# Patient Record
Sex: Female | Born: 1950 | Race: White | Hispanic: No | Marital: Married | Smoking: Former smoker
Health system: Southern US, Community
[De-identification: ages and names within clinical notes are randomized; demographics above are authoritative.]

## PROBLEM LIST (undated history)

## (undated) DIAGNOSIS — C801 Malignant (primary) neoplasm, unspecified: Secondary | ICD-10-CM

## (undated) DIAGNOSIS — K5900 Constipation, unspecified: Secondary | ICD-10-CM

## (undated) DIAGNOSIS — F32A Depression, unspecified: Secondary | ICD-10-CM

## (undated) DIAGNOSIS — F419 Anxiety disorder, unspecified: Secondary | ICD-10-CM

## (undated) DIAGNOSIS — I10 Essential (primary) hypertension: Secondary | ICD-10-CM

## (undated) DIAGNOSIS — H269 Unspecified cataract: Secondary | ICD-10-CM

## (undated) DIAGNOSIS — L409 Psoriasis, unspecified: Secondary | ICD-10-CM

## (undated) DIAGNOSIS — K219 Gastro-esophageal reflux disease without esophagitis: Secondary | ICD-10-CM

## (undated) DIAGNOSIS — M1990 Unspecified osteoarthritis, unspecified site: Secondary | ICD-10-CM

## (undated) HISTORY — DX: Gastro-esophageal reflux disease without esophagitis: K21.9

## (undated) HISTORY — DX: Unspecified osteoarthritis, unspecified site: M19.90

## (undated) HISTORY — PX: BELT ABDOMINOPLASTY: SHX1215

## (undated) HISTORY — PX: EYE SURGERY: SHX253

## (undated) HISTORY — DX: Unspecified cataract: H26.9

## (undated) HISTORY — DX: Depression, unspecified: F32.A

## (undated) HISTORY — DX: Essential (primary) hypertension: I10

## (undated) HISTORY — PX: JOINT REPLACEMENT: SHX530

## (undated) HISTORY — PX: CATARACT EXTRACTION: SUR2

## (undated) HISTORY — PX: MELANOMA EXCISION: SHX5266

---

## 2013-09-06 ENCOUNTER — Encounter (HOSPITAL_BASED_OUTPATIENT_CLINIC_OR_DEPARTMENT_OTHER): Payer: Self-pay | Admitting: Emergency Medicine

## 2013-09-06 ENCOUNTER — Emergency Department (HOSPITAL_BASED_OUTPATIENT_CLINIC_OR_DEPARTMENT_OTHER): Payer: Medicare Other

## 2013-09-06 ENCOUNTER — Emergency Department (HOSPITAL_BASED_OUTPATIENT_CLINIC_OR_DEPARTMENT_OTHER)
Admission: EM | Admit: 2013-09-06 | Discharge: 2013-09-06 | Disposition: A | Discharge status: Home / Self Care | Payer: Medicare Other | Attending: Emergency Medicine | Admitting: Emergency Medicine

## 2013-09-06 DIAGNOSIS — F411 Generalized anxiety disorder: Secondary | ICD-10-CM | POA: Insufficient documentation

## 2013-09-06 DIAGNOSIS — F419 Anxiety disorder, unspecified: Secondary | ICD-10-CM

## 2013-09-06 DIAGNOSIS — R202 Paresthesia of skin: Secondary | ICD-10-CM

## 2013-09-06 DIAGNOSIS — Z7982 Long term (current) use of aspirin: Secondary | ICD-10-CM | POA: Insufficient documentation

## 2013-09-06 DIAGNOSIS — R209 Unspecified disturbances of skin sensation: Secondary | ICD-10-CM | POA: Insufficient documentation

## 2013-09-06 DIAGNOSIS — Z87891 Personal history of nicotine dependence: Secondary | ICD-10-CM | POA: Insufficient documentation

## 2013-09-06 LAB — CBC WITH DIFFERENTIAL/PLATELET
Basophils Absolute: 0 10*3/uL (ref 0.0–0.1)
Basophils Relative: 1 % (ref 0–1)
EOS PCT: 4 % (ref 0–5)
Eosinophils Absolute: 0.2 10*3/uL (ref 0.0–0.7)
HCT: 38.9 % (ref 36.0–46.0)
Hemoglobin: 13.1 g/dL (ref 12.0–15.0)
LYMPHS PCT: 23 % (ref 12–46)
Lymphs Abs: 1 10*3/uL (ref 0.7–4.0)
MCH: 32 pg (ref 26.0–34.0)
MCHC: 33.7 g/dL (ref 30.0–36.0)
MCV: 95.1 fL (ref 78.0–100.0)
Monocytes Absolute: 0.3 10*3/uL (ref 0.1–1.0)
Monocytes Relative: 7 % (ref 3–12)
NEUTROS ABS: 2.7 10*3/uL (ref 1.7–7.7)
Neutrophils Relative %: 65 % (ref 43–77)
PLATELETS: 216 10*3/uL (ref 150–400)
RBC: 4.09 MIL/uL (ref 3.87–5.11)
RDW: 12.1 % (ref 11.5–15.5)
WBC: 4.1 10*3/uL (ref 4.0–10.5)

## 2013-09-06 LAB — BASIC METABOLIC PANEL
ANION GAP: 15 (ref 5–15)
BUN: 10 mg/dL (ref 6–23)
CALCIUM: 9.5 mg/dL (ref 8.4–10.5)
CHLORIDE: 100 meq/L (ref 96–112)
CO2: 24 meq/L (ref 19–32)
CREATININE: 0.7 mg/dL (ref 0.50–1.10)
GFR calc Af Amer: >90 mL/min (ref >90)
GFR calc non Af Amer: 90 mL/min — ABNORMAL LOW (ref >90)
Glucose, Bld: 120 mg/dL — ABNORMAL HIGH (ref 70–99)
Potassium: 4.1 mEq/L (ref 3.7–5.3)
SODIUM: 139 meq/L (ref 137–147)

## 2013-09-06 LAB — TROPONIN I: Troponin I: <0.3 ng/mL (ref <0.30)

## 2013-09-06 MED ORDER — ASPIRIN 81 MG PO CHEW: 81.0000 mg | CHEWABLE_TABLET | Freq: Every day | ORAL | Status: DC

## 2013-09-06 MED ORDER — LORAZEPAM 2 MG/ML IJ SOLN
0.5000 mg | Freq: Once | INTRAMUSCULAR | Status: AC
  Administered 2013-09-06: 0.5 mg via INTRAVENOUS
  Filled 2013-09-06: qty 1

## 2013-09-06 NOTE — ED Notes (Signed)
Reports SHOB. Left arm tingling. Stroke screen negative. Feels like a lump in her throat.

## 2013-09-06 NOTE — ED Provider Notes (Signed)
CSN: [id number]     Arrival date & time 09/06/13  1536 History   First MD Initiated Contact with Patient 09/06/13 1543     Chief Complaint  Patient presents with  . Shortness of Breath     (Consider location/radiation/quality/duration/timing/severity/associated sxs/prior Treatment) HPI  This is a 1 are old female with no significant past medical history who presents with left arm tingling and shortness of breath. Patient reports onset of symptoms at 2 PM while she was at work. She works at [employer] and sits at a computer most the day. She noted left arm tingling "like my arm was going to sleep."  She denies any weakness of the arm or hand. She reports increasing anxiety symptoms and subsequently developed shortness of breath and "a lump in my throat. Symptoms have currently resolved. She also reports dry mouth. She denies any chest pain, recent fevers, recent cough. Patient denies any weakness, numbness, speech difficulty or any other focal deficits. Patient is a former smoker but otherwise denies history of heart pressure or high cholesterol.  History reviewed. No pertinent past medical history. History reviewed. No pertinent past surgical history. No family history on file. History  Substance Use Topics  . Smoking status: Former Research scientist (life sciences)  . Smokeless tobacco: Not on file  . Alcohol Use: No   OB History   Grav Para Term Preterm Abortions TAB SAB Ect Mult Living                 Review of Systems  Constitutional: Negative for fever.  HENT:       Dry mouth  Eyes: Negative for visual disturbance.  Respiratory: Positive for shortness of breath. Negative for cough and chest tightness.   Cardiovascular: Negative for chest pain.  Gastrointestinal: Negative for nausea, vomiting, abdominal pain and diarrhea.  Genitourinary: Negative for dysuria.  Musculoskeletal: Negative for back pain.  Skin: Negative for wound.  Neurological: Positive for numbness. Negative for dizziness, speech  difficulty, weakness and headaches.  Psychiatric/Behavioral: Negative for confusion. The patient is nervous/anxious.   All other systems reviewed and are negative.     Allergies  Review of patient's allergies indicates no known allergies.  Home Medications   Prior to Admission medications   Medication Sig Start Date End Date Taking? Authorizing Provider  aspirin 81 MG chewable tablet Chew 1 tablet (81 mg total) by mouth daily. 09/06/13   Merryl Hacker, MD   BP 152/75  Pulse 57  Temp(Src) 98.1 F (36.7 C) (Oral)  Resp 20  Ht 5\' 3"  (1.6 m)  Wt 110 lb (49.896 kg)  BMI 19.49 kg/m2  SpO2 100% Physical Exam  Nursing note and vitals reviewed. Constitutional: She is oriented to person, place, and time. She appears well-developed and well-nourished.  Anxious and tearful  HENT:  Head: Normocephalic and atraumatic.  Mouth/Throat: Oropharynx is clear and moist.  Eyes: EOM are normal. Pupils are equal, round, and reactive to light.  Neck: Neck supple.  Cardiovascular: Normal rate, regular rhythm and normal heart sounds.   No murmur heard. Pulmonary/Chest: Effort normal and breath sounds normal. No respiratory distress. She has no wheezes.  Abdominal: Soft. Bowel sounds are normal. There is no tenderness. There is no rebound and no guarding.  Musculoskeletal: She exhibits no edema.  Neurological: She is alert and oriented to person, place, and time.  Cranial nerves II through XII intact, no dysmetria to finger-nose-finger, no drift noted, 5 out of 5 strength in all 4 extremities, no clonus noted  Skin: Skin is warm and dry.  Psychiatric: She has a normal mood and affect.    ED Course  Procedures (including critical care time) Labs Review Labs Reviewed  BASIC METABOLIC PANEL - Abnormal; Notable for the following:    Glucose, Bld 120 (*)    GFR calc non Af Amer 90 (*)    All other components within normal limits  CBC WITH DIFFERENTIAL  TROPONIN I    Imaging Review Dg Chest  2 View  09/06/2013   CLINICAL DATA:  Shortness of breath.  Anxiety.  EXAM: CHEST  2 VIEW  COMPARISON:  None.  FINDINGS: Cardiomediastinal silhouette unremarkable. Lungs clear. Bronchovascular markings normal. Pulmonary vascularity normal. No visible pleural effusions. No pneumothorax. Thoracic scoliosis convex right with compensatory thoracolumbar scoliosis convex left. Mild degenerative changes involving the thoracic spine.  IMPRESSION: No acute cardiopulmonary disease.   Electronically Signed   By: Evangeline Dakin M.D.   On: 09/06/2013 16:42   Ct Head Wo Contrast  09/06/2013   CLINICAL DATA:  Anxiety.  EXAM: CT HEAD WITHOUT CONTRAST  TECHNIQUE: Contiguous axial images were obtained from the base of the skull through the vertex without intravenous contrast.  COMPARISON:  No prior.  FINDINGS: No mass. No hydrocephalus. No hemorrhage. No acute bony abnormality. There has paranasal sinuses and mastoids are clear no acute bony abnormality.  IMPRESSION: No acute abnormality.   Electronically Signed   By: Marcello Moores  Register   On: 09/06/2013 16:44     EKG Interpretation   Date/Time:  Tuesday September 06 2013 15:51:27 EDT Ventricular Rate:  71 PR Interval:  144 QRS Duration: 90 QT Interval:  406 QTC Calculation: 441 R Axis:   70 Text Interpretation:  Normal sinus rhythm Normal ECG NO prior for  comparison Confirmed by HORTON  MD, COURTNEY ([id number]) on 09/06/2013 3:54:07  PM      MDM   Final diagnoses:  Arm paresthesia, right  Anxiety    Patient presents with left arm tingling and shortness of breath. She is nontoxic but very anxious appearing on exam. She is nonfocal. Considerations include nerve impingement, paresthesia, TIA. Suspect subsequent symptoms likely related to anxiety. Vital signs are reassuring. EKG and troponin negative. Chest x-ray reassuring.  Basic labwork also reassuring. Patient reports complete resolution of symptoms following IV Ativan. She continues to be nonfocal. Head CT is  negative. Patient cannot get an MRI because she currently has metal braces. At this time have low suspicion for TIA and feel patient symptoms may be related to nerve impingement and subsequent anxiety. Patient's ABCD2 score is 2, 1 for age and 1 for hypertension at evaluation.  Discussed with patient all possibilities. Have offered the patient admission for formal TIA rule out versus close PCP followup and outpatient workup. Patient has elected outpatient workup. Will place patient on a baby aspirin daily. Patient was given strict return precautions.  After history, exam, and medical workup I feel the patient has been appropriately medically screened and is safe for discharge home. Pertinent diagnoses were discussed with the patient. Patient was given return precautions.     Merryl Hacker, MD 09/06/13 [phone]

## 2013-09-06 NOTE — Discharge Instructions (Signed)

## 2013-09-16 DIAGNOSIS — F419 Anxiety disorder, unspecified: Secondary | ICD-10-CM | POA: Insufficient documentation

## 2013-09-16 DIAGNOSIS — F410 Panic disorder [episodic paroxysmal anxiety] without agoraphobia: Secondary | ICD-10-CM | POA: Insufficient documentation

## 2013-10-21 DIAGNOSIS — C4371 Malignant melanoma of right lower limb, including hip: Secondary | ICD-10-CM | POA: Insufficient documentation

## 2013-10-21 DIAGNOSIS — Z0000 Encounter for general adult medical examination without abnormal findings: Secondary | ICD-10-CM | POA: Insufficient documentation

## 2013-10-21 DIAGNOSIS — L409 Psoriasis, unspecified: Secondary | ICD-10-CM | POA: Insufficient documentation

## 2015-01-23 DIAGNOSIS — M25511 Pain in right shoulder: Secondary | ICD-10-CM | POA: Insufficient documentation

## 2015-02-04 DIAGNOSIS — M19011 Primary osteoarthritis, right shoulder: Secondary | ICD-10-CM | POA: Insufficient documentation

## 2015-02-04 DIAGNOSIS — M25819 Other specified joint disorders, unspecified shoulder: Secondary | ICD-10-CM | POA: Insufficient documentation

## 2016-01-15 DIAGNOSIS — R0781 Pleurodynia: Secondary | ICD-10-CM | POA: Insufficient documentation

## 2016-01-15 DIAGNOSIS — G4700 Insomnia, unspecified: Secondary | ICD-10-CM | POA: Insufficient documentation

## 2016-12-12 DIAGNOSIS — G2581 Restless legs syndrome: Secondary | ICD-10-CM | POA: Insufficient documentation

## 2017-07-14 DIAGNOSIS — K582 Mixed irritable bowel syndrome: Secondary | ICD-10-CM | POA: Insufficient documentation

## 2017-07-20 ENCOUNTER — Other Ambulatory Visit: Payer: Self-pay

## 2017-07-20 ENCOUNTER — Emergency Department (HOSPITAL_BASED_OUTPATIENT_CLINIC_OR_DEPARTMENT_OTHER)
Admission: EM | Admit: 2017-07-20 | Discharge: 2017-07-20 | Disposition: A | Discharge status: Home / Self Care | Payer: Medicare HMO | Attending: Physician Assistant | Admitting: Physician Assistant

## 2017-07-20 ENCOUNTER — Encounter (HOSPITAL_BASED_OUTPATIENT_CLINIC_OR_DEPARTMENT_OTHER): Payer: Self-pay | Admitting: Emergency Medicine

## 2017-07-20 ENCOUNTER — Emergency Department (HOSPITAL_BASED_OUTPATIENT_CLINIC_OR_DEPARTMENT_OTHER): Payer: Medicare HMO

## 2017-07-20 DIAGNOSIS — Z7982 Long term (current) use of aspirin: Secondary | ICD-10-CM | POA: Insufficient documentation

## 2017-07-20 DIAGNOSIS — R109 Unspecified abdominal pain: Secondary | ICD-10-CM | POA: Diagnosis not present

## 2017-07-20 DIAGNOSIS — Z79899 Other long term (current) drug therapy: Secondary | ICD-10-CM | POA: Insufficient documentation

## 2017-07-20 DIAGNOSIS — Z85820 Personal history of malignant melanoma of skin: Secondary | ICD-10-CM | POA: Insufficient documentation

## 2017-07-20 DIAGNOSIS — E860 Dehydration: Secondary | ICD-10-CM | POA: Diagnosis not present

## 2017-07-20 DIAGNOSIS — R197 Diarrhea, unspecified: Secondary | ICD-10-CM | POA: Insufficient documentation

## 2017-07-20 HISTORY — DX: Constipation, unspecified: K59.00

## 2017-07-20 HISTORY — DX: Psoriasis, unspecified: L40.9

## 2017-07-20 HISTORY — DX: Malignant (primary) neoplasm, unspecified: C80.1

## 2017-07-20 HISTORY — DX: Anxiety disorder, unspecified: F41.9

## 2017-07-20 LAB — CBC WITH DIFFERENTIAL/PLATELET
Basophils Absolute: 0 10*3/uL (ref 0.0–0.1)
Basophils Relative: 1 %
EOS PCT: 1 %
Eosinophils Absolute: 0 10*3/uL (ref 0.0–0.7)
HEMATOCRIT: 37 % (ref 36.0–46.0)
Hemoglobin: 13.2 g/dL (ref 12.0–15.0)
LYMPHS PCT: 15 %
Lymphs Abs: 0.5 10*3/uL — ABNORMAL LOW (ref 0.7–4.0)
MCH: 32.9 pg (ref 26.0–34.0)
MCHC: 35.7 g/dL (ref 30.0–36.0)
MCV: 92.3 fL (ref 78.0–100.0)
MONOS PCT: 20 %
Monocytes Absolute: 0.6 10*3/uL (ref 0.1–1.0)
Neutro Abs: 2.1 10*3/uL (ref 1.7–7.7)
Neutrophils Relative %: 63 %
Platelets: 288 10*3/uL (ref 150–400)
RBC: 4.01 MIL/uL (ref 3.87–5.11)
RDW: 11.2 % — ABNORMAL LOW (ref 11.5–15.5)
WBC: 3.2 10*3/uL — AB (ref 4.0–10.5)

## 2017-07-20 LAB — COMPREHENSIVE METABOLIC PANEL
ALBUMIN: 3.8 g/dL (ref 3.5–5.0)
ALT: 14 U/L (ref 14–54)
AST: 19 U/L (ref 15–41)
Alkaline Phosphatase: 47 U/L (ref 38–126)
Anion gap: 11 (ref 5–15)
BUN: 8 mg/dL (ref 6–20)
CO2: 24 mmol/L (ref 22–32)
Calcium: 8.7 mg/dL — ABNORMAL LOW (ref 8.9–10.3)
Chloride: 95 mmol/L — ABNORMAL LOW (ref 101–111)
Creatinine, Ser: 0.62 mg/dL (ref 0.44–1.00)
GFR calc Af Amer: >60 mL/min (ref >60)
GFR calc non Af Amer: >60 mL/min (ref >60)
Glucose, Bld: 103 mg/dL — ABNORMAL HIGH (ref 65–99)
POTASSIUM: 3.5 mmol/L (ref 3.5–5.1)
SODIUM: 130 mmol/L — AB (ref 135–145)
Total Bilirubin: 0.3 mg/dL (ref 0.3–1.2)
Total Protein: 6.5 g/dL (ref 6.5–8.1)

## 2017-07-20 LAB — LIPASE, BLOOD: Lipase: 32 U/L (ref 11–51)

## 2017-07-20 MED ORDER — SODIUM CHLORIDE 0.9 % IV BOLUS
1000.0000 mL | Freq: Once | INTRAVENOUS | Status: AC
  Administered 2017-07-20: 1000 mL via INTRAVENOUS

## 2017-07-20 MED ORDER — IOPAMIDOL (ISOVUE-300) INJECTION 61%
100.0000 mL | Freq: Once | INTRAVENOUS | Status: AC | PRN
  Administered 2017-07-20: 100 mL via INTRAVENOUS

## 2017-07-20 MED ORDER — AMOXICILLIN-POT CLAVULANATE 500-125 MG PO TABS: 1.0000 | ORAL_TABLET | Freq: Three times a day (TID) | ORAL | 0 refills | Status: DC

## 2017-07-20 MED FILL — AMOX-CLAV 500-125 MG TABLET: 500-125 | 7 days supply | Qty: 21 | Fill #0

## 2017-07-20 NOTE — Discharge Instructions (Addendum)
Your lab work has been reassuring.  Mild signs of dehydration.  Would recommend drinking plenty of fluids stay hydrated.  I have given antibiotics to treat for infectious process of your intestines.  You can provide a stool sample please take this to your primary care doctor.  Follow-up with your gastroenterologist on Wednesday.  Would also recommend taking a probiotic.  Return the ED with any worsening symptoms.

## 2017-07-20 NOTE — ED Triage Notes (Signed)
Watery diarrhea x3 weeks.  Saw pmd and has GI appt in 2 days. Taking Pepto with no affect. Denies abd pain.  Denies vomiting.  Denies blood or mucous in the stool.  Sts she is feeling weak and "not so good".

## 2017-07-22 NOTE — ED Provider Notes (Signed)
[facility] EMERGENCY DEPARTMENT Provider Note   CSN: [id number] Arrival date & time: 07/20/17  2202     History   Chief Complaint Chief Complaint  Patient presents with  . Diarrhea    HPI Carly Matthews is a 67 y.o. female.  HPI 67 year old Caucasian female with past medical history significant for anxiety presents to the emergency department today for evaluation of diarrhea.  Patient states the diarrhea has been ongoing for the past 3 weeks.  Reports that the diarrhea is watery and foul-smelling in nature.  She has tried Pepto-Bismol with a little relief.  Patient states that she is having several loose stools a day.  States this depends on what she eats.  She reports intermittent cramping but denies any abdominal pain at this time.  Does report some bloating and gas pain.  Patient does report history of chronic constipation.  Denies any history of bowel obstructions or abdominal surgeries.  She denies any blood in her stool.  Denies any associated fevers.  Denies any recent antibiotic use.  No known sick contacts with same symptoms.  No recent travel.  No unintentional weight loss.  Last colonoscopy was in 2016 that showed diverticulosis and had 3 polyps removed.  Patient denies any recent new foods.  She saw her primary care doctor on 5/20 for same symptoms.  She declined stool samples at that time.  She was given a GI follow-up for 2 days.  Husband at bedside states that they did not want to wait that long to figure out what was going on with her because this has been too long now.  Pt denies any fever, chill, ha, vision changes, lightheadedness, dizziness, congestion, neck pain, cp, sob, cough, abd pain,urinary symptoms, melena, hematochezia, lower extremity paresthesias.     Past Medical History:  Diagnosis Date  . Anxiety   . Cancer ([place])   . Constipation   . Psoriasis     There are no active problems to display for this patient.   Past Surgical History:    Procedure Laterality Date  . CATARACT EXTRACTION    . EYE SURGERY    . MELANOMA EXCISION       OB History   None      Home Medications    Prior to Admission medications   Medication Sig Start Date End Date Taking? Authorizing Provider  buPROPion (WELLBUTRIN XL) 300 MG 24 hr tablet Take 300 mg by mouth daily.   Yes [provider]  escitalopram (LEXAPRO) 10 MG tablet Take 10 mg by mouth daily.   Yes [provider]  meloxicam (MOBIC) 15 MG tablet Take 15 mg by mouth daily.   Yes [provider]  amoxicillin-clavulanate (AUGMENTIN) 500-125 MG tablet Take 1 tablet (500 mg total) by mouth every 8 (eight) hours. 07/20/17   Doristine Devoid, PA-C  aspirin 81 MG chewable tablet Chew 1 tablet (81 mg total) by mouth daily. 09/06/13   Horton, Barbette Hair, MD    Family History No family history on file.  Social History Social History   Tobacco Use  . Smoking status: Former Research scientist (life sciences)  . Smokeless tobacco: Never Used  Substance Use Topics  . Alcohol use: Yes    Alcohol/week: 0.6 oz    Types: 1 Glasses of wine per week  . Drug use: No     Allergies   Patient has no known allergies.   Review of Systems Review of Systems  All other systems reviewed and are negative.  Physical Exam Updated Vital Signs BP 135/65 (BP Location: Left Arm)   Pulse 67   Temp (!) 97.5 F (36.4 C) (Oral)   Resp 18   Ht 5\' 1"  (1.549 m)   Wt 47.6 kg (105 lb)   SpO2 100%   BMI 19.84 kg/m   Physical Exam  Constitutional: She is oriented to person, place, and time. She appears well-developed and well-nourished.  Non-toxic appearance. No distress.  HENT:  Head: Normocephalic and atraumatic.  Nose: Nose normal.  Mouth/Throat: Oropharynx is clear and moist.  Mucous membranes are moist.  Eyes: Pupils are equal, round, and reactive to light. Conjunctivae are normal. Right eye exhibits no discharge. Left eye exhibits no discharge.  Neck: Normal range of motion. Neck  supple.  Cardiovascular: Normal rate, regular rhythm, normal heart sounds and intact distal pulses. Exam reveals no gallop and no friction rub.  No murmur heard. Pulmonary/Chest: Effort normal and breath sounds normal. No stridor. No respiratory distress. She has no wheezes. She has no rales. She exhibits no tenderness.  Abdominal: Soft. Normal appearance. Bowel sounds are increased. There is no tenderness. There is no rigidity, no rebound, no guarding, no CVA tenderness, no tenderness at McBurney's point and negative Murphy's sign.  Musculoskeletal: Normal range of motion. She exhibits no tenderness.  Lymphadenopathy:    She has no cervical adenopathy.  Neurological: She is alert and oriented to person, place, and time.  Skin: Skin is warm and dry. Capillary refill takes less than 2 seconds.  Good skin turgor.  Psychiatric: Her behavior is normal. Judgment and thought content normal.  Nursing note and vitals reviewed.    ED Treatments / Results  Labs (all labs ordered are listed, but only abnormal results are displayed) Labs Reviewed  COMPREHENSIVE METABOLIC PANEL - Abnormal; Notable for the following components:      Result Value   Sodium 130 (*)    Chloride 95 (*)    Glucose, Bld 103 (*)    Calcium 8.7 (*)    All other components within normal limits  CBC WITH DIFFERENTIAL/PLATELET - Abnormal; Notable for the following components:   WBC 3.2 (*)    RDW 11.2 (*)    Lymphs Abs 0.5 (*)    All other components within normal limits  GASTROINTESTINAL PANEL BY PCR, STOOL (REPLACES STOOL CULTURE)  LIPASE, BLOOD    EKG None  Radiology No results found.  Procedures Procedures (including critical care time)  Medications Ordered in ED Medications  sodium chloride 0.9 % bolus 1,000 mL (0 mLs Intravenous Stopped 07/20/17 1220)  iopamidol (ISOVUE-300) 61 % injection 100 mL (100 mLs Intravenous Contrast Given 07/20/17 1332)     Initial Impression / Assessment and Plan / ED Course    I have reviewed the triage vital signs and the nursing notes.  Pertinent labs & imaging results that were available during my care of the patient were reviewed by me and considered in my medical decision making (see chart for details).     Presents to the emergency department for evaluation of diarrhea for the past 3 weeks.  Saw her primary care doctor last week for same symptoms.  Has follow-up with GI in 2 days.  Patient reports constant diarrhea.  Denies any associated abdominal pain, vomiting or fevers.  Denies any bloody stools.  No recent travel.  Vital signs reassuring.  Patient is afebrile.  No tachycardia or hypotension noted.  Patient does not meet Sirs or sepsis criteria.  On exam bowel sounds are  increased.  No focal abdominal tenderness.  No signs of peritonitis.  No significant signs of dehydration.  No CVA tenderness.  Heart regular rate and rhythm.  Lungs clear to auscultation bilaterally.  Lab work reveals no leukocytosis.  Hemoglobin is baseline and normal.  Normal lipase.  Mild signs of dehydration on CMP with mild hyponatremia 130.  Otherwise elect lites are reassuring.  Normal liver function.  Normal anion gap and kidney function.  CT scan of abdomen;  IMPRESSION:  1. Subtle slight edema of the mucosa of the descending and sigmoid  portions of the colon which might represent mild colitis.  2. No other significant abnormality.     Discussed with pharmacy.  Will start patient on Augmentin.  I have asked for a stool sample however patient given a history of diarrhea has not been able to provide a stool sample after several hours in the ED.  Patient given fluids.  Patient feels improved and ready for discharge at this time.  Do not feel the patient needs inpatient treatment with IV antibiotics or fluids at this time.  Instructed patient to provide stool sample and give to her primary care doctor.  I doubt C. difficile patient has not been able to provide a stool sample in  the ED today.  Patient has no recent antibiotic use and is afebrile.   On repeat assessment patient continues to have no abdominal pain.  She does have follow-up with a GI doctor in 2 days.  Differential diagnosis includes irritable bowel syndrome, ulcerative colitis, chronic diarrhea.  Pt is hemodynamically stable, in NAD, & able to ambulate in the ED. Evaluation does not show pathology that would require ongoing emergent intervention or inpatient treatment. I explained the diagnosis to the patient. Pain has been managed & has no complaints prior to dc. Pt is comfortable with above plan and is stable for discharge at this time. All questions were answered prior to disposition. Strict return precautions for f/u to the ED were discussed. Encouraged follow up with PCP.  This was discussed with my attending who is agreed with the above plan.  Final Clinical Impressions(s) / ED Diagnoses   Final diagnoses:  Diarrhea, unspecified type  Dehydration    ED Discharge Orders        Ordered    amoxicillin-clavulanate (AUGMENTIN) 500-125 MG tablet  Every 8 hours     07/20/17 1528       Doristine Devoid, PA-C 07/22/17 1657    Mackuen, Fredia Sorrow, MD 07/25/17 1709

## 2017-08-25 DIAGNOSIS — K52831 Collagenous colitis: Secondary | ICD-10-CM | POA: Insufficient documentation

## 2019-01-13 DIAGNOSIS — M1712 Unilateral primary osteoarthritis, left knee: Secondary | ICD-10-CM | POA: Insufficient documentation

## 2020-01-05 IMAGING — CT CT ABD-PELV W/ CM
2 of 5 series · 16 of 46 positions shown, 18 images · IV contrast (APPLIED)
Comparison: None.

CLINICAL DATA: Diarrhea for 3 weeks.

EXAM:
CT ABDOMEN AND PELVIS WITH CONTRAST
TECHNIQUE: Multidetector CT imaging of the abdomen and pelvis was performed
using the standard protocol following bolus administration of
intravenous contrast.
CONTRAST:  100mL 5U6Y9P-4HH IOPAMIDOL (5U6Y9P-4HH) INJECTION 61%

[Series 2: axial st · axial · 0.72mm/px · z∈[-750,-400]mm · 13 of 80 slices shown, 15 images]
[im 5/80  soft-tissue]
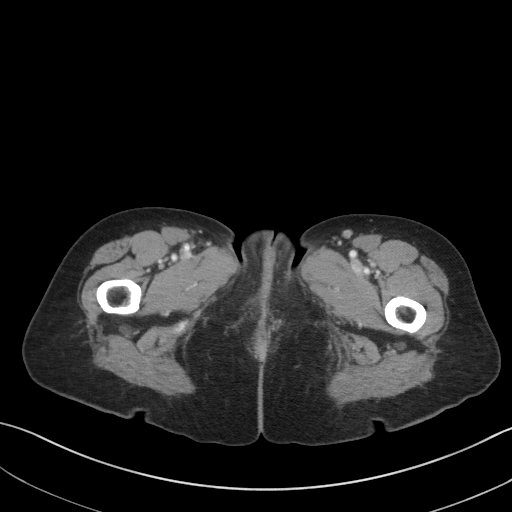
[im 5/80  bone]
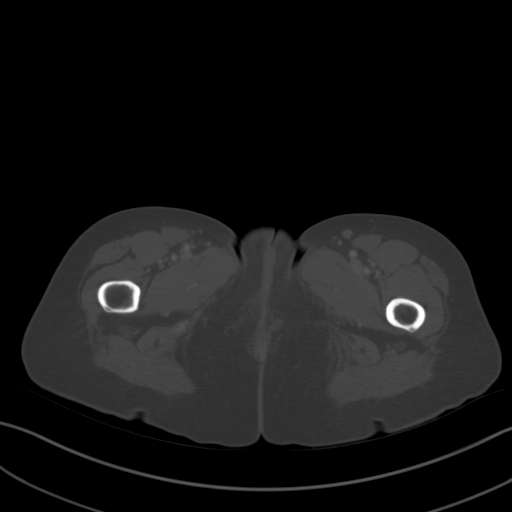
[im 10/80  soft-tissue]
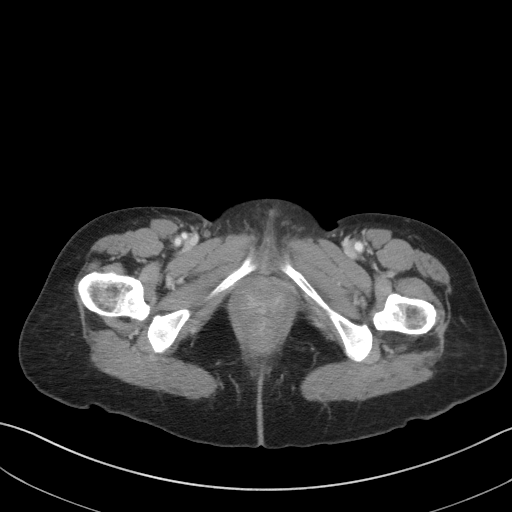
[im 19/80  soft-tissue]
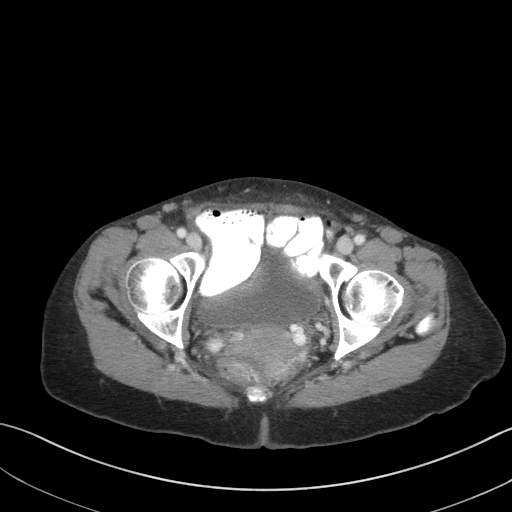
[im 24/80  soft-tissue]
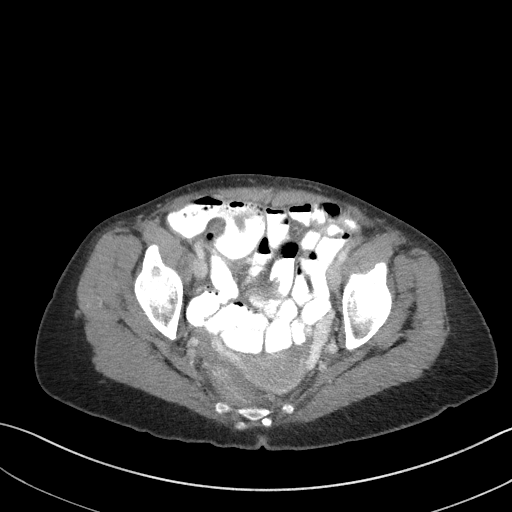
[im 28/80  soft-tissue]
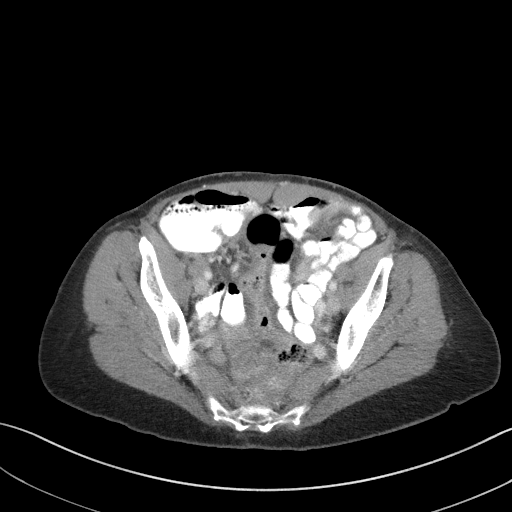
[im 33/80  soft-tissue]
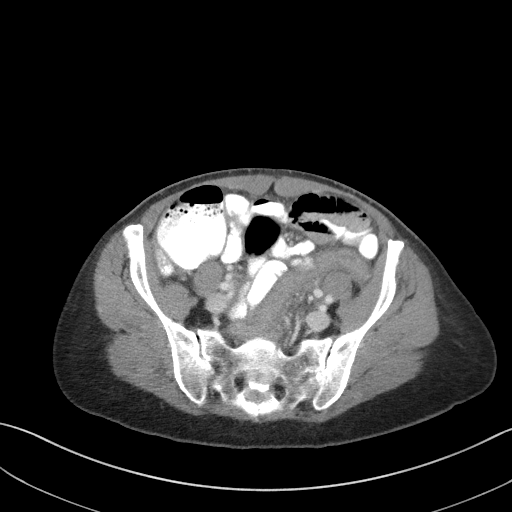
[im 42/80  soft-tissue]
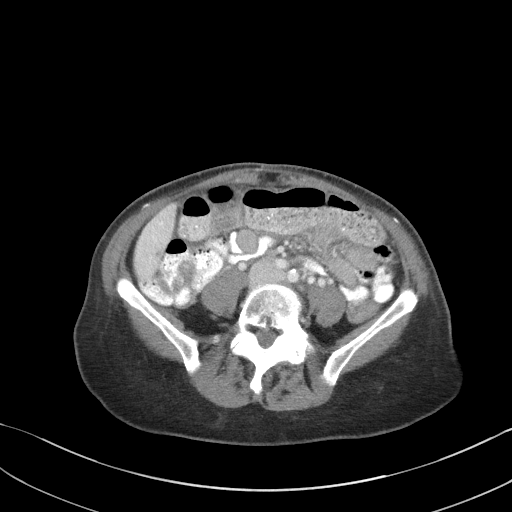
[im 47/80  soft-tissue]
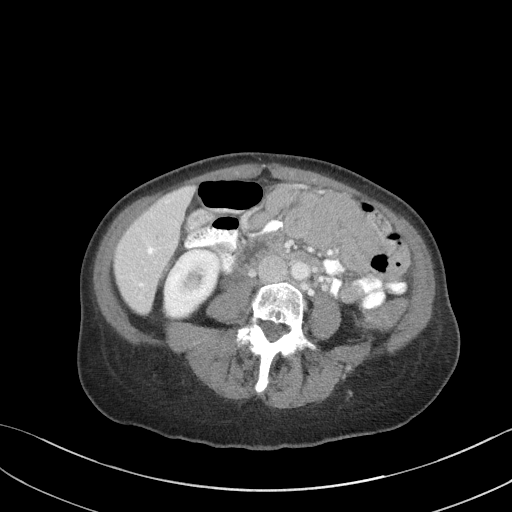
[im 52/80  soft-tissue]
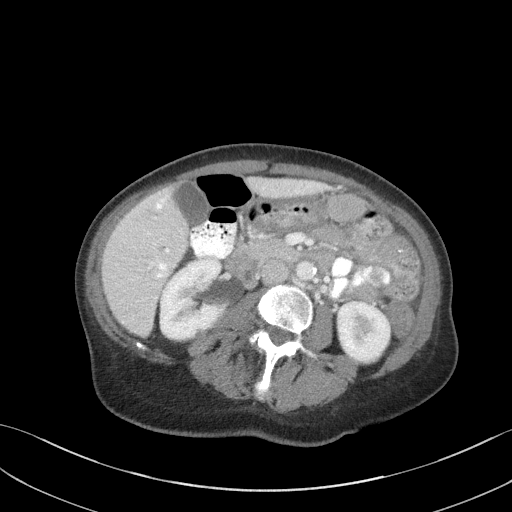
[im 52/80  bone]
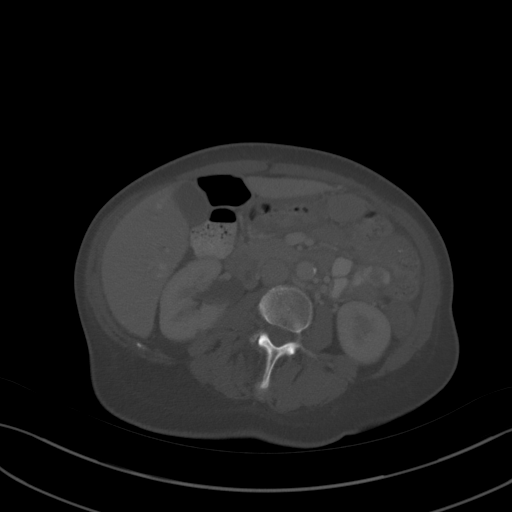
[im 56/80  soft-tissue]
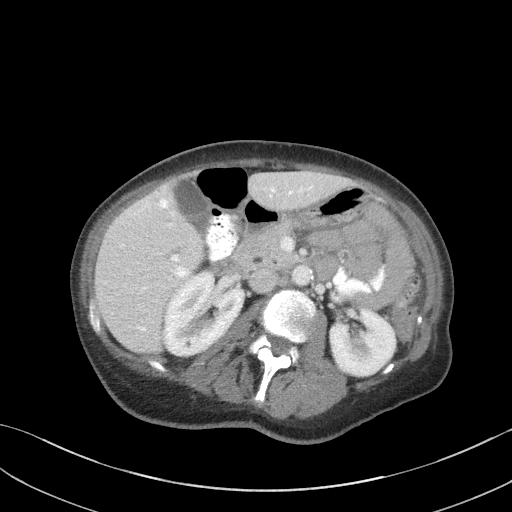
[im 61/80  soft-tissue]
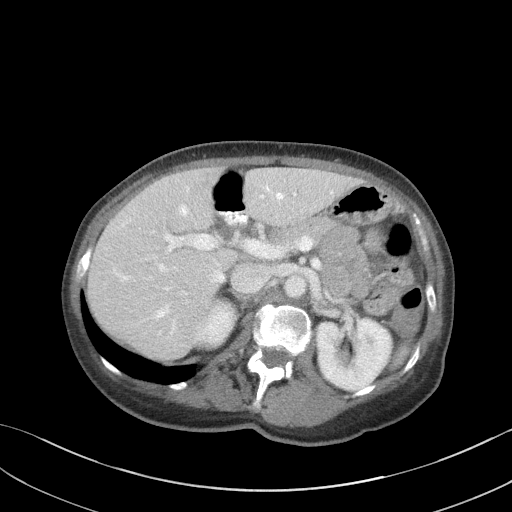
[im 70/80  soft-tissue]
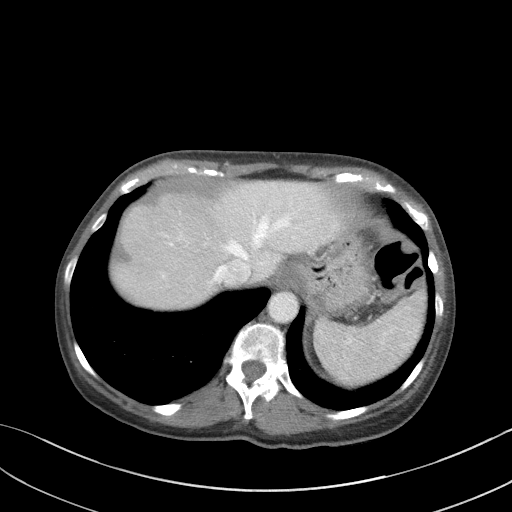
[im 75/80  soft-tissue]
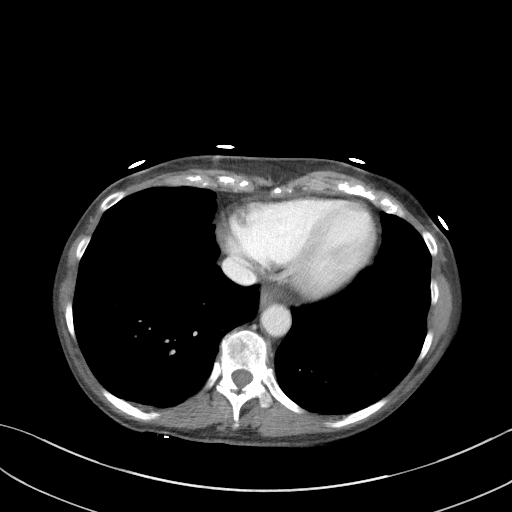

[Series 5: coronal st · coronal · 0.68mm/px · 3 of 73 slices shown]
[im 25/73  soft-tissue]
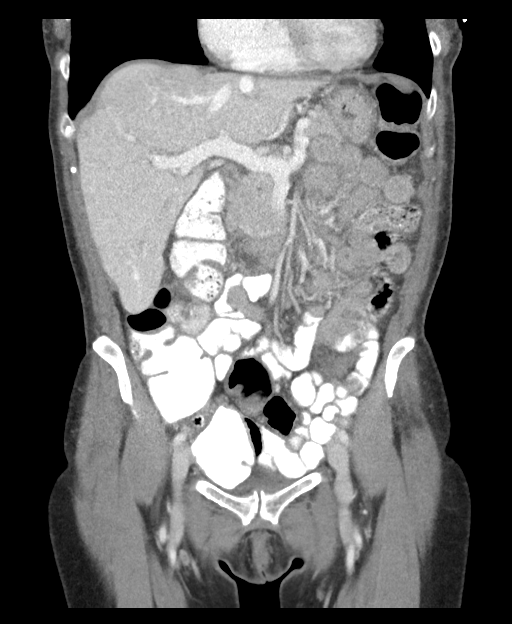
[im 33/73  soft-tissue]
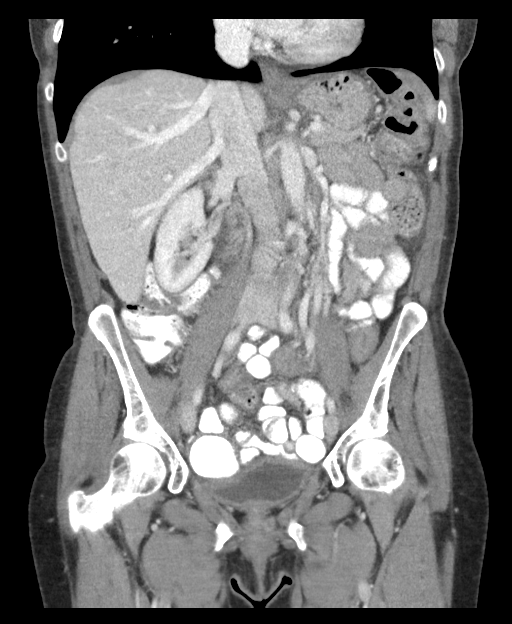
[im 41/73  soft-tissue]
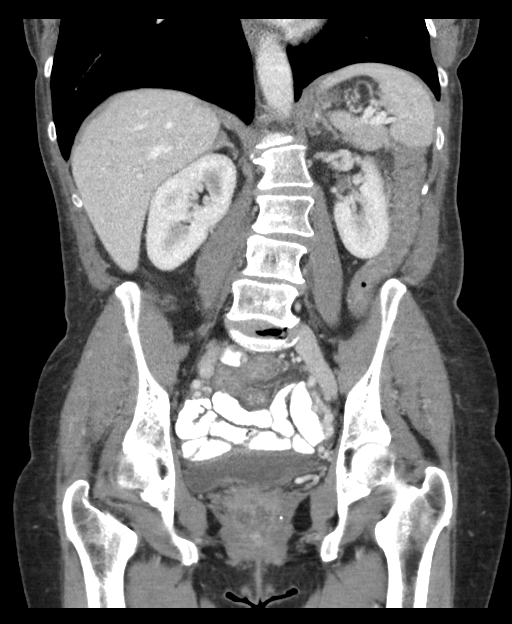

[16 of 46 positions shown; findings below may reference images not displayed]

FINDINGS: Lower chest: Normal.

Hepatobiliary: No focal liver abnormality is seen. No gallstones,
gallbladder wall thickening, or biliary dilatation.

Pancreas: Unremarkable. No pancreatic ductal dilatation or
surrounding inflammatory changes.

Spleen: Normal in size without focal abnormality.

Adrenals/Urinary Tract: Adrenal glands are normal. Tiny bilateral
renal cysts. No renal calculi. No hydronephrosis. Bladder appears
normal.

Stomach/Bowel: There are no dilated loops of large small bowel.
Terminal ileum and appendix are normal. Moderate stool in the
ascending and transverse portions of the colon. There is only
minimal stool in the descending and sigmoid portions of the colon.
There are few diverticula in the sigmoid region. Slight mucosal
prominence in the distal colon which could represent mild colitis.

Vascular/Lymphatic: Slight aortic atherosclerosis. No enlarged
abdominal or pelvic lymph nodes.

Reproductive: Uterus and bilateral adnexa are unremarkable.

Other: No abdominal wall hernia or abnormality. No abdominopelvic
ascites.

Musculoskeletal: No acute abnormality. Degenerative disc and joint
disease at L5-S1. Thoracolumbar scoliosis.
IMPRESSION: 1. Subtle slight edema of the mucosa of the descending and sigmoid
portions of the colon which might represent mild colitis.
2. No other significant abnormality.

## 2020-09-13 DIAGNOSIS — H25812 Combined forms of age-related cataract, left eye: Secondary | ICD-10-CM | POA: Insufficient documentation

## 2020-09-13 DIAGNOSIS — H524 Presbyopia: Secondary | ICD-10-CM | POA: Insufficient documentation

## 2020-09-13 DIAGNOSIS — H52222 Regular astigmatism, left eye: Secondary | ICD-10-CM | POA: Insufficient documentation

## 2021-09-09 DIAGNOSIS — R198 Other specified symptoms and signs involving the digestive system and abdomen: Secondary | ICD-10-CM | POA: Insufficient documentation

## 2021-09-09 DIAGNOSIS — K219 Gastro-esophageal reflux disease without esophagitis: Secondary | ICD-10-CM | POA: Insufficient documentation

## 2021-09-09 DIAGNOSIS — Z800 Family history of malignant neoplasm of digestive organs: Secondary | ICD-10-CM | POA: Insufficient documentation

## 2022-08-15 DIAGNOSIS — I10 Essential (primary) hypertension: Secondary | ICD-10-CM | POA: Insufficient documentation

## 2022-09-03 DIAGNOSIS — M179 Osteoarthritis of knee, unspecified: Secondary | ICD-10-CM | POA: Insufficient documentation

## 2022-09-03 HISTORY — PX: TOTAL KNEE ARTHROPLASTY: SHX125

## 2023-03-23 ENCOUNTER — Other Ambulatory Visit: Payer: Self-pay

## 2023-03-23 ENCOUNTER — Other Ambulatory Visit: Payer: Self-pay | Admitting: Family Medicine

## 2023-03-23 MED ORDER — DULOXETINE HCL 60 MG PO CPEP: 60.0000 mg | ORAL_CAPSULE | Freq: Every day | ORAL | 0 refills | Status: DC

## 2023-03-23 NOTE — Telephone Encounter (Signed)
Copied from CRM [id number]. Topic: Clinical - Medication Refill >> Mar 23, 2023  8:55 AM Carlatta H wrote: Most Recent Primary Care Visit:   Medication: Duloxetine 60 mg Capsule   Has the patient contacted their pharmacy? Yes (Agent: If no, request that the patient contact the pharmacy for the refill. If patient does not wish to contact the pharmacy document the reason why and proceed with request.) (Agent: If yes, when and what did the pharmacy advise?)  Is this the correct pharmacy for this prescription? Yes If no, delete pharmacy and type the correct one.  This is the patient's preferred pharmacy: [facility] [id number] - [place], [place] - [address] [place] [address] Phone: [phone] Fax: [phone] Hours: Not open 24 hours     Has the prescription been filled recently? No  Is the patient out of the medication? No  Has the patient been seen for an appointment in the last year OR does the patient have an upcoming appointment? Yes  Can we respond through MyChart? No  Agent: Please be advised that Rx refills may take up to 3 business days. We ask that you follow-up with your pharmacy.

## 2023-03-24 DIAGNOSIS — Z1231 Encounter for screening mammogram for malignant neoplasm of breast: Secondary | ICD-10-CM | POA: Diagnosis not present

## 2023-03-24 DIAGNOSIS — Z780 Asymptomatic menopausal state: Secondary | ICD-10-CM | POA: Diagnosis not present

## 2023-03-24 DIAGNOSIS — M8589 Other specified disorders of bone density and structure, multiple sites: Secondary | ICD-10-CM | POA: Diagnosis not present

## 2023-03-24 DIAGNOSIS — M8580 Other specified disorders of bone density and structure, unspecified site: Secondary | ICD-10-CM | POA: Diagnosis not present

## 2023-04-16 DIAGNOSIS — M1712 Unilateral primary osteoarthritis, left knee: Secondary | ICD-10-CM | POA: Diagnosis not present

## 2023-05-13 DIAGNOSIS — Z961 Presence of intraocular lens: Secondary | ICD-10-CM | POA: Insufficient documentation

## 2023-05-13 DIAGNOSIS — H18513 Endothelial corneal dystrophy, bilateral: Secondary | ICD-10-CM | POA: Insufficient documentation

## 2023-05-13 DIAGNOSIS — H40002 Preglaucoma, unspecified, left eye: Secondary | ICD-10-CM | POA: Diagnosis not present

## 2023-05-13 DIAGNOSIS — H43813 Vitreous degeneration, bilateral: Secondary | ICD-10-CM | POA: Insufficient documentation

## 2023-05-13 DIAGNOSIS — H04123 Dry eye syndrome of bilateral lacrimal glands: Secondary | ICD-10-CM | POA: Diagnosis not present

## 2023-06-15 DIAGNOSIS — Z79899 Other long term (current) drug therapy: Secondary | ICD-10-CM | POA: Diagnosis not present

## 2023-06-24 DIAGNOSIS — K080 Exfoliation of teeth due to systemic causes: Secondary | ICD-10-CM | POA: Diagnosis not present

## 2023-06-25 DIAGNOSIS — K080 Exfoliation of teeth due to systemic causes: Secondary | ICD-10-CM | POA: Diagnosis not present

## 2023-06-29 ENCOUNTER — Encounter: Payer: Self-pay | Admitting: Family Medicine

## 2023-06-29 ENCOUNTER — Ambulatory Visit (INDEPENDENT_AMBULATORY_CARE_PROVIDER_SITE_OTHER): Payer: Medicare HMO | Admitting: Family Medicine

## 2023-06-29 VITALS — BP 122/70 | HR 61 | Temp 98.0°F | Ht 61.0 in | Wt 113.5 lb

## 2023-06-29 DIAGNOSIS — K52831 Collagenous colitis: Secondary | ICD-10-CM

## 2023-06-29 DIAGNOSIS — L409 Psoriasis, unspecified: Secondary | ICD-10-CM

## 2023-06-29 DIAGNOSIS — M179 Osteoarthritis of knee, unspecified: Secondary | ICD-10-CM | POA: Diagnosis not present

## 2023-06-29 DIAGNOSIS — I10 Essential (primary) hypertension: Secondary | ICD-10-CM

## 2023-06-29 DIAGNOSIS — F419 Anxiety disorder, unspecified: Secondary | ICD-10-CM

## 2023-06-29 DIAGNOSIS — E7800 Pure hypercholesterolemia, unspecified: Secondary | ICD-10-CM | POA: Diagnosis not present

## 2023-06-29 DIAGNOSIS — Z1321 Encounter for screening for nutritional disorder: Secondary | ICD-10-CM | POA: Diagnosis not present

## 2023-06-29 DIAGNOSIS — R5383 Other fatigue: Secondary | ICD-10-CM | POA: Insufficient documentation

## 2023-06-29 DIAGNOSIS — K219 Gastro-esophageal reflux disease without esophagitis: Secondary | ICD-10-CM

## 2023-06-29 DIAGNOSIS — M8580 Other specified disorders of bone density and structure, unspecified site: Secondary | ICD-10-CM | POA: Insufficient documentation

## 2023-06-29 DIAGNOSIS — M1712 Unilateral primary osteoarthritis, left knee: Secondary | ICD-10-CM

## 2023-06-29 DIAGNOSIS — Z800 Family history of malignant neoplasm of digestive organs: Secondary | ICD-10-CM

## 2023-06-29 MED ORDER — DULOXETINE HCL 60 MG PO CPEP
60.0000 mg | ORAL_CAPSULE | Freq: Every day | ORAL | 1 refills | Status: DC
Start: 2023-06-29 — End: 2023-12-29

## 2023-06-29 NOTE — Progress Notes (Signed)
 Patient Office Visit  Assessment & Plan:  Primary hypertension -     CBC with Differential/Platelet -     Comprehensive metabolic panel with GFR  Anxiety -     DULoxetine  HCl; Take 1 capsule (60 mg total) by mouth daily.  Dispense: 90 capsule; Refill: 1  Gastroesophageal reflux disease without esophagitis  Osteoarthritis of knee, unspecified laterality, unspecified osteoarthritis type -     DULoxetine  HCl; Take 1 capsule (60 mg total) by mouth daily.  Dispense: 90 capsule; Refill: 1  Other fatigue -     VITAMIN D 25 Hydroxy (Vit-D Deficiency, Fractures) -     TSH -     Vitamin B12  Psoriasis of scalp  Collagenous colitis -     Ambulatory referral to Gastroenterology  Family history of colon cancer -     Ambulatory referral to Gastroenterology  Elevated cholesterol -     Lipid panel  Osteopenia, unspecified location  Primary osteoarthritis of left knee   Test results were reviewed and analyzed as part of the medical decision making of this visit.  Reviewed previous primary care notes from [facility], orthopedic notes, GI notes and previous labs.  GI consult ordered with [facility].  Follow-up on lab work notify patient.  Reviewed DEXA scan during the office visit.  Patient will continue with calcium vitamin D weightbearing and strength training.  Return in 6 months or sooner if necessary. Recommend healthy diet i.e mediterranean/DASH diet, consistent exercise - 30 minutes 5 day per week, and gradual weight loss.  Return in about 6 months (around 12/29/2023), or if symptoms worsen or fail to improve.   Subjective:    Patient ID: Carly Matthews, female    DOB: 08-Feb-1951  Age: 73 y.o. MRN: [id number]  No chief complaint on file.   HPI Establish primary care and discuss chronic medical issues.  HTN-using antihypertensive medication without difficulty.  Denies associated signs and symptoms including chest pain, shortness of breath, cough headache, peripheral  swelling cramps spasms and palpitations.  Voices understanding of the potential for interference with blood pressure control with substances including high sodium intake, decongestions, herbal supplements weight loss supplements nutritional supplements.  Blood pressures at home are less than 140/90.   Hyperlipidemia-denies unusual muscle aches or muscle cramps or difficulty tolerating statin therapy.  Aware of need for diet control, exercise and healthy eating.  Patient is aware not to consume grapefruit juice or pomegranate juice. Left knee OA- pt sees Dr. Rosamond Comes re knee pain. Pt had right TKR in the past. Pt still having left knee pain and is preventing her from doing certain things.  Psoriasis- pt sees Dr. Raynaldo Call at [facility]. Pt does take Methotrexate for this. Has been on this medication a long time.  Fatigue- pt has been feeling tired by the end of the day. 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 Pt is sleeping better and wakes up refreshed. Pt not sure why she's feeling tired.  Collagenous colitis/family history of colon cancer-patient does need a colonoscopy however she would like to get it sooner rather than later.  Patient would like to see [facility] regarding this. Osteopenia-patient did have a bone density at [facility] this January.  Patient does take calcium vitamin D and does walk.  Patient states the walking has not been as vigorous as previously due to left knee pain.  Patient thinks she has been more sedentary lately. Anxiety-patient taking Cymbalta  60 mg  once a day.  Patient does not having unusual stressors but always has ongoing stressors.  Patient still has a travel business and works full-time.  Patient always worries about her children.  Patient thinks that the Cymbalta  is helping with her mood and also with arthritis pain. History of melanoma lower leg-patient does see dermatology on a regular basis for the melanoma and psoriasis.  Patient has not had  any new skin cancers. Health Maintenance-patient does need colonoscopy.  Patient is up-to-date on tetanus shot, up-to-date on Shingrix vaccines, up-to-date on pneumococcal vaccines and Tdap.  Has up-to-date mammogram. The 10-year ASCVD risk score (Arnett DK, et al., 2019) is: 17.1%  Past Medical History:  Diagnosis Date   Anxiety    Cancer (HCC)    Constipation    Depression    GERD (gastroesophageal reflux disease)    Hypertension    Psoriasis    Past Surgical History:  Procedure Laterality Date   BELT ABDOMINOPLASTY     CATARACT EXTRACTION Bilateral    2013 and 2024   EYE SURGERY     MELANOMA EXCISION     TOTAL KNEE ARTHROPLASTY Right 09/03/2022   Social History   Tobacco Use   Smoking status: Former   Smokeless tobacco: Never  Vaping Use   Vaping status: Never Used  Substance Use Topics   Alcohol use: Yes    Alcohol/week: 1.0 standard drink of alcohol    Types: 1 Glasses of wine per week   Drug use: No   Family History  Problem Relation Age of Onset   Cancer Mother    Hypertension Mother    Colon cancer Father    Hypertension Father    Kidney cancer Son    Breast cancer Neg Hx    Uterine cancer Neg Hx    Ovarian cancer Neg Hx    No Known Allergies  ROS    Objective:    BP 122/70   Pulse 61   Temp 98 F (36.7 C)   Ht 5\' 1"  (1.549 m)   Wt 113 lb 8 oz (51.5 kg)   SpO2 99%   BMI 21.45 kg/m  BP Readings from Last 3 Encounters:  06/29/23 122/70  07/20/17 135/65  09/06/13 152/75   Wt Readings from Last 3 Encounters:  06/29/23 113 lb 8 oz (51.5 kg)  07/20/17 105 lb (47.6 kg)  09/06/13 110 lb (49.9 kg)    Physical Exam Vitals and nursing note reviewed.  Constitutional:      Appearance: Normal appearance.  HENT:     Head: Normocephalic.     Right Ear: Tympanic membrane, ear canal and external ear normal.     Left Ear: Tympanic membrane, ear canal and external ear normal.  Eyes:     Extraocular Movements: Extraocular movements intact.      Pupils: Pupils are equal, round, and reactive to light.  Cardiovascular:     Rate and Rhythm: Normal rate and regular rhythm.     Heart sounds: Normal heart sounds.  Pulmonary:     Effort: Pulmonary effort is normal.     Breath sounds: Normal breath sounds. No wheezing.  Abdominal:     Tenderness: There is no abdominal tenderness.  Musculoskeletal:     Right lower leg: No edema.     Left lower leg: No edema.  Neurological:     General: No focal deficit present.     Mental Status: She is alert and oriented to person, place, and time.  Psychiatric:  Mood and Affect: Mood normal.        Behavior: Behavior normal.        Thought Content: Thought content normal.        Judgment: Judgment normal.      No results found for any visits on 06/29/23.

## 2023-06-30 ENCOUNTER — Encounter: Payer: Self-pay | Admitting: Family Medicine

## 2023-06-30 LAB — CBC WITH DIFFERENTIAL/PLATELET
Absolute Lymphocytes: 1351 {cells}/uL (ref 850–3900)
Absolute Monocytes: 460 {cells}/uL (ref 200–950)
Basophils Absolute: 112 {cells}/uL (ref 0–200)
Basophils Relative: 1.9 %
Eosinophils Absolute: 330 {cells}/uL (ref 15–500)
Eosinophils Relative: 5.6 %
HCT: 38.3 % (ref 35.0–45.0)
Hemoglobin: 12.4 g/dL (ref 11.7–15.5)
MCH: 31.5 pg (ref 27.0–33.0)
MCHC: 32.4 g/dL (ref 32.0–36.0)
MCV: 97.2 fL (ref 80.0–100.0)
MPV: 12 fL (ref 7.5–12.5)
Monocytes Relative: 7.8 %
Neutro Abs: 3646 {cells}/uL (ref 1500–7800)
Neutrophils Relative %: 61.8 %
Platelets: 274 10*3/uL (ref 140–400)
RBC: 3.94 10*6/uL (ref 3.80–5.10)
RDW: 13.6 % (ref 11.0–15.0)
Total Lymphocyte: 22.9 %
WBC: 5.9 10*3/uL (ref 3.8–10.8)

## 2023-06-30 LAB — COMPREHENSIVE METABOLIC PANEL WITH GFR
AG Ratio: 2 (calc) (ref 1.0–2.5)
ALT: 34 U/L — ABNORMAL HIGH (ref 6–29)
AST: 40 U/L — ABNORMAL HIGH (ref 10–35)
Albumin: 4.3 g/dL (ref 3.6–5.1)
Alkaline phosphatase (APISO): 60 U/L (ref 37–153)
BUN/Creatinine Ratio: 38 (calc) — ABNORMAL HIGH (ref 6–22)
BUN: 21 mg/dL (ref 7–25)
CO2: 26 mmol/L (ref 20–32)
Calcium: 9 mg/dL (ref 8.6–10.4)
Chloride: 103 mmol/L (ref 98–110)
Creat: 0.55 mg/dL — ABNORMAL LOW (ref 0.60–1.00)
Globulin: 2.2 g/dL (ref 1.9–3.7)
Glucose, Bld: 87 mg/dL (ref 65–99)
Potassium: 4.5 mmol/L (ref 3.5–5.3)
Sodium: 139 mmol/L (ref 135–146)
Total Bilirubin: 0.4 mg/dL (ref 0.2–1.2)
Total Protein: 6.5 g/dL (ref 6.1–8.1)
eGFR: 96 mL/min/{1.73_m2} (ref >=60)

## 2023-06-30 LAB — VITAMIN D 25 HYDROXY (VIT D DEFICIENCY, FRACTURES): Vit D, 25-Hydroxy: 73 ng/mL (ref 30–100)

## 2023-06-30 LAB — LIPID PANEL
Cholesterol: 183 mg/dL (ref <200)
HDL: 67 mg/dL (ref >=50)
LDL Cholesterol (Calc): 99 mg/dL
Non-HDL Cholesterol (Calc): 116 mg/dL (ref <130)
Total CHOL/HDL Ratio: 2.7 (calc) (ref <5.0)
Triglycerides: 82 mg/dL (ref <150)

## 2023-06-30 LAB — TSH: TSH: 1.57 m[IU]/L (ref 0.40–4.50)

## 2023-06-30 LAB — VITAMIN B12: Vitamin B-12: 824 pg/mL (ref 200–1100)

## 2023-07-13 ENCOUNTER — Other Ambulatory Visit: Payer: Self-pay

## 2023-07-13 DIAGNOSIS — E7800 Pure hypercholesterolemia, unspecified: Secondary | ICD-10-CM

## 2023-07-13 NOTE — Telephone Encounter (Signed)
 Prescription Request  07/13/2023  LOV: 06/29/23  What is the name of the medication or equipment? atorvastatin (LIPITOR) 10 MG tablet [[id number]]   Have you contacted your pharmacy to request a refill? Yes   Which pharmacy would you like this sent to?  [facility] [id number] - [place], [place] - [address] [place] [address] Phone: [phone] Fax: [phone]    Patient notified that their request is being sent to the clinical staff for review and that they should receive a response within 2 business days.   Please advise at [facility] [phone]

## 2023-07-15 MED ORDER — ATORVASTATIN CALCIUM 10 MG PO TABS: 10.0000 mg | ORAL_TABLET | Freq: Every day | ORAL | 1 refills | Status: DC

## 2023-07-15 NOTE — Telephone Encounter (Signed)
 Requested medication (s) are due for refill today - unsure  Requested medication (s) are on the active medication list -yes  Future visit scheduled -no  Last refill: unknown  Notes to clinic: listed as historical medication -sent for PCP review   Requested Prescriptions  Pending Prescriptions Disp Refills   atorvastatin (LIPITOR) 10 MG tablet      Sig: Take 1 tablet (10 mg total) by mouth daily.     Cardiovascular:  Antilipid - Statins Failed - 07/15/2023 12:52 PM      Failed - Lipid Panel in normal range within the last 12 months    Cholesterol  Date Value Ref Range Status  06/29/2023 183 <200 mg/dL Final   LDL Cholesterol (Calc)  Date Value Ref Range Status  06/29/2023 99 mg/dL (calc) Final    Comment:    Reference range: <100 . Desirable range <100 mg/dL for primary prevention;   <70 mg/dL for patients with CHD or diabetic patients  with > or = 2 CHD risk factors. Aaron Aas LDL-C is now calculated using the Martin-Hopkins  calculation, which is a validated novel method providing  better accuracy than the Friedewald equation in the  estimation of LDL-C.  Melinda Sprawls et al. Erroll Heard. 8119;147(82): 2061-2068  (http://education.[facility].com/faq/FAQ164)    HDL  Date Value Ref Range Status  06/29/2023 67 > OR = 50 mg/dL Final   Triglycerides  Date Value Ref Range Status  06/29/2023 82 <150 mg/dL Final         Passed - Patient is not pregnant      Passed - Valid encounter within last 12 months    Recent Outpatient Visits           2 weeks ago Primary hypertension   [place] [facility] Amadeo June, MD                 Requested Prescriptions  Pending Prescriptions Disp Refills   atorvastatin (LIPITOR) 10 MG tablet      Sig: Take 1 tablet (10 mg total) by mouth daily.     Cardiovascular:  Antilipid - Statins Failed - 07/15/2023 12:52 PM      Failed - Lipid Panel in normal range within the last 12 months    Cholesterol  Date Value  Ref Range Status  06/29/2023 183 <200 mg/dL Final   LDL Cholesterol (Calc)  Date Value Ref Range Status  06/29/2023 99 mg/dL (calc) Final    Comment:    Reference range: <100 . Desirable range <100 mg/dL for primary prevention;   <70 mg/dL for patients with CHD or diabetic patients  with > or = 2 CHD risk factors. Aaron Aas LDL-C is now calculated using the Martin-Hopkins  calculation, which is a validated novel method providing  better accuracy than the Friedewald equation in the  estimation of LDL-C.  Melinda Sprawls et al. Erroll Heard. 9562;130(86): 2061-2068  (http://education.[facility].com/faq/FAQ164)    HDL  Date Value Ref Range Status  06/29/2023 67 > OR = 50 mg/dL Final   Triglycerides  Date Value Ref Range Status  06/29/2023 82 <150 mg/dL Final         Passed - Patient is not pregnant      Passed - Valid encounter within last 12 months    Recent Outpatient Visits           2 weeks ago Primary hypertension   [place] [facility] Amadeo June, MD

## 2023-07-16 ENCOUNTER — Other Ambulatory Visit (HOSPITAL_COMMUNITY): Payer: Self-pay

## 2023-07-16 ENCOUNTER — Other Ambulatory Visit: Payer: Self-pay | Admitting: Family Medicine

## 2023-07-16 DIAGNOSIS — I10 Essential (primary) hypertension: Secondary | ICD-10-CM

## 2023-07-16 NOTE — Telephone Encounter (Signed)
 Prescription Request  07/16/2023  LOV: 06/29/2023  What is the name of the medication or equipment? amLODipine (NORVASC) 5 MG tablet   Have you contacted your pharmacy to request a refill? Yes   Which pharmacy would you like this sent to?  [facility] [id number] - [place], [place] - [address] [place] [address] Phone: [phone] Fax: [phone]    Patient notified that their request is being sent to the clinical staff for review and that they should receive a response within 2 business days.   Please advise at [facility] [phone]

## 2023-07-17 ENCOUNTER — Other Ambulatory Visit: Payer: Self-pay

## 2023-07-17 ENCOUNTER — Telehealth: Payer: Self-pay | Admitting: Family Medicine

## 2023-07-17 MED ORDER — AMLODIPINE BESYLATE 5 MG PO TABS: 5.0000 mg | ORAL_TABLET | Freq: Every day | ORAL | 1 refills | Status: DC

## 2023-07-17 NOTE — Telephone Encounter (Signed)
 Copied from CRM [id number]. Topic: Clinical - Medication Refill >> Jul 17, 2023  1:18 PM Baldemar Lev wrote: Pt reports that all of her prescriptions need new scripts because her PCP is now in a new location.   Medication: amLODipine (NORVASC) 5 MG tablet  atorvastatin (LIPITOR) 10 MG tablet   Has the patient contacted their pharmacy? Yes (Agent: If no, request that the patient contact the pharmacy for the refill. If patient does not wish to contact the pharmacy document the reason why and proceed with request.) (Agent: If yes, when and what did the pharmacy advise?)  This is the patient's preferred pharmacy:  [facility] [id number] - [place], [place] - [address] [place] [address] Phone: [phone] Fax: [phone]  Is this the correct pharmacy for this prescription? Yes If no, delete pharmacy and type the correct one.   Has the prescription been filled recently? Yes  Is the patient out of the medication? No.  Has the patient been seen for an appointment in the last year OR does the patient have an upcoming appointment? Yes  Can we respond through MyChart? Yes  Agent: Please be advised that Rx refills may take up to 3 business days. We ask that you follow-up with your pharmacy.

## 2023-07-29 ENCOUNTER — Telehealth: Payer: Self-pay | Admitting: Family Medicine

## 2023-07-29 DIAGNOSIS — Z85820 Personal history of malignant melanoma of skin: Secondary | ICD-10-CM | POA: Diagnosis not present

## 2023-07-29 DIAGNOSIS — L820 Inflamed seborrheic keratosis: Secondary | ICD-10-CM | POA: Diagnosis not present

## 2023-07-29 DIAGNOSIS — Z129 Encounter for screening for malignant neoplasm, site unspecified: Secondary | ICD-10-CM | POA: Diagnosis not present

## 2023-07-29 DIAGNOSIS — L400 Psoriasis vulgaris: Secondary | ICD-10-CM | POA: Diagnosis not present

## 2023-07-29 NOTE — Telephone Encounter (Signed)
 Copied from CRM [id number]. Topic: Clinical - Lab/Test Results >> Jul 29, 2023 12:55 PM Felizardo Hotter wrote: Reason for CRM: Pt wants to know if she might get labs at [facility] clinic for a August appt? Please call [phone] pt to let her know if possible and schedule.

## 2023-08-05 ENCOUNTER — Telehealth: Payer: Self-pay | Admitting: Family Medicine

## 2023-08-05 ENCOUNTER — Other Ambulatory Visit: Payer: Self-pay

## 2023-08-05 DIAGNOSIS — R7989 Other specified abnormal findings of blood chemistry: Secondary | ICD-10-CM

## 2023-08-05 NOTE — Telephone Encounter (Signed)
 Copied from CRM [id number]. Topic: Clinical - Lab/Test Results >> Aug 04, 2023 12:34 PM Felizardo Hotter wrote: Reason for CRM:  Pt returning [employer] message. Pt would like orders sent to [facility] office in [facility] on [address] in [place].

## 2023-08-27 DIAGNOSIS — K626 Ulcer of anus and rectum: Secondary | ICD-10-CM | POA: Diagnosis not present

## 2023-08-27 DIAGNOSIS — K5730 Diverticulosis of large intestine without perforation or abscess without bleeding: Secondary | ICD-10-CM | POA: Diagnosis not present

## 2023-08-27 DIAGNOSIS — K621 Rectal polyp: Secondary | ICD-10-CM | POA: Diagnosis not present

## 2023-08-27 DIAGNOSIS — Z8719 Personal history of other diseases of the digestive system: Secondary | ICD-10-CM | POA: Diagnosis not present

## 2023-08-27 DIAGNOSIS — K648 Other hemorrhoids: Secondary | ICD-10-CM | POA: Diagnosis not present

## 2023-08-27 DIAGNOSIS — Z1211 Encounter for screening for malignant neoplasm of colon: Secondary | ICD-10-CM | POA: Diagnosis not present

## 2023-09-14 ENCOUNTER — Encounter: Payer: Self-pay | Admitting: Family Medicine

## 2023-09-16 ENCOUNTER — Ambulatory Visit

## 2023-09-16 VITALS — Ht 61.0 in | Wt 113.0 lb

## 2023-09-16 DIAGNOSIS — Z0000 Encounter for general adult medical examination without abnormal findings: Secondary | ICD-10-CM | POA: Diagnosis not present

## 2023-09-16 NOTE — Patient Instructions (Signed)
 [name] , Thank you for taking time out of your busy schedule to complete your Annual Wellness Visit with me. I enjoyed our conversation and look forward to speaking with you again next year. I, as well as your care team,  appreciate your ongoing commitment to your health goals. Please review the following plan we discussed and let me know if I can assist you in the future. Your Game plan/ To Do List     Follow up Visits: Next Medicare AWV with our clinical staff: In 1 year    Have you seen your provider in the last 6 months (3 months if uncontrolled diabetes)? Yes Next Office Visit with your provider: 12/29/23 @ 9:20  Clinician Recommendations:  Aim for 30 minutes of exercise or brisk walking, 6-8 glasses of water, and 5 servings of fruits and vegetables each day.       This is a list of the screening recommended for you and due dates:  Health Maintenance  Topic Date Due   Hepatitis C Screening  Never done   COVID-19 Vaccine (7 - Pfizer risk 2024-25 season) 06/20/2023   Flu Shot  10/02/2023   Medicare Annual Wellness Visit  09/15/2024   Mammogram  03/23/2025   DTaP/Tdap/Td vaccine (3 - Td or Tdap) 10/29/2031   Colon Cancer Screening  08/26/2033   Pneumococcal Vaccine for age over 63  Completed   DEXA scan (bone density measurement)  Completed   Zoster (Shingles) Vaccine  Completed   Hepatitis B Vaccine  Aged Out   HPV Vaccine  Aged Out   Meningitis B Vaccine  Aged Out    Advanced directives: (ACP Link)Information on Advanced Care Planning can be found at [employer] ge[employer] Advance Health Care Directives Advance Health Care Directives. [email or web address]   Advance Care Planning is important because it:  [x]  Makes sure you receive the medical care that is consistent with your values, goals, and preferences  [x]  It provides guidance to your family and loved ones and reduces their decisional burden about whether or not they are making the right decisions based on your  wishes.  Follow the link provided in your after visit summary or read over the paperwork we have mailed to you to help you started getting your Advance Directives in place. If you need assistance in completing these, please reach out to us  so that we can help you!  See attachments for Preventive Care and Fall Prevention Tips.

## 2023-09-16 NOTE — Progress Notes (Signed)
 Subjective:   Carly Matthews is a 73 y.o. who presents for a Medicare Wellness preventive visit.  As a reminder, Annual Wellness Visits don't include a physical exam, and some assessments may be limited, especially if this visit is performed virtually. We may recommend an in-person follow-up visit with your provider if needed.  Visit Complete: Virtual I connected with  Carly Matthews on 09/16/23 by a video and audio enabled telemedicine application and verified that I am speaking with the correct person using two identifiers.  Patient Location: Home  Provider Location: Home Office  I discussed the limitations of evaluation and management by telemedicine. The patient expressed understanding and agreed to proceed.  Vital Signs: Because this visit was a virtual/telehealth visit, some criteria may be missing or patient reported. Any vitals not documented were not able to be obtained and vitals that have been documented are patient reported.  Persons Participating in Visit: Patient.  AWV Questionnaire: Yes: Patient Medicare AWV questionnaire was completed by the patient on 09/16/23; I have confirmed that all information answered by patient is correct and no changes since this date.  Cardiac Risk Factors include: advanced age (>20men, >99 women);hypertension     Objective:    Today's Vitals   09/16/23 1156  Weight: 113 lb (51.3 kg)  Height: 5' 1 (1.549 m)   Body mass index is 21.35 kg/m.     09/16/2023   12:00 PM 07/20/2017   10:17 AM  Advanced Directives  Does Patient Have a Medical Advance Directive? No Yes   Does patient want to make changes to medical advance directive?  No - Patient declined   Would patient like information on creating a medical advance directive? Yes (MAU/Ambulatory/Procedural Areas - Information given)      Data saved with a previous flowsheet row definition    Current Medications (verified) Outpatient Encounter Medications as of 09/16/2023  Medication  Sig   amLODipine  (NORVASC ) 5 MG tablet Take 1 tablet (5 mg total) by mouth daily.   atorvastatin  (LIPITOR) 10 MG tablet Take 1 tablet (10 mg total) by mouth daily.   Calcium  Carb-Cholecalciferol (OYSTER SHELL CALCIUM  W/D) 500-5 MG-MCG TABS Take by mouth.   DULoxetine  (CYMBALTA ) 60 MG capsule Take 1 capsule (60 mg total) by mouth daily.   folic acid (FOLVITE) 1 MG tablet Take 1 mg by mouth daily.   methotrexate (RHEUMATREX) 2.5 MG tablet Take 15 mg by mouth once a week.   Multiple Vitamin (MULTI-VITAMIN) tablet Take 1 tablet by mouth daily.   pantoprazole (PROTONIX) 40 MG tablet Take 40 mg by mouth daily.   No facility-administered encounter medications on file as of 09/16/2023.    Allergies (verified) Patient has no known allergies.   History: Past Medical History:  Diagnosis Date   Anxiety    Arthritis    Cancer (HCC)    Cataract    Constipation    Depression    GERD (gastroesophageal reflux disease)    Hypertension    Psoriasis    Past Surgical History:  Procedure Laterality Date   BELT ABDOMINOPLASTY     CATARACT EXTRACTION Bilateral    2013 and 2024   EYE SURGERY     JOINT REPLACEMENT  09/03/2022   MELANOMA EXCISION     TOTAL KNEE ARTHROPLASTY Right 09/03/2022   Family History  Problem Relation Age of Onset   Cancer Mother    Hypertension Mother    Arthritis Mother    Colon cancer Father    Hypertension Father  Alcohol abuse Father    Kidney cancer Son    Breast cancer Neg Hx    Uterine cancer Neg Hx    Ovarian cancer Neg Hx    Social History   Socioeconomic History   Marital status: Married    Spouse name: Not on file   Number of children: Not on file   Years of education: Not on file   Highest education level: Bachelor's degree (e.g., BA, AB, BS)  Occupational History   Not on file  Tobacco Use   Smoking status: Former    Current packs/day: 0.00    Average packs/day: 0.5 packs/day for 10.0 years (5.0 ttl pk-yrs)    Types: Cigarettes    Quit  date: 02/05/2009    Years since quitting: 14.6   Smokeless tobacco: Never   Tobacco comments:    Smoked off and on. Never much. socially  Vaping Use   Vaping status: Never Used  Substance and Sexual Activity   Alcohol use: Yes    Alcohol/week: 4.0 standard drinks of alcohol    Types: 4 Glasses of wine per week   Drug use: No   Sexual activity: Yes    Birth control/protection: None  Other Topics Concern   Not on file  Social History Narrative   Not on file   Social Drivers of Health   Financial Resource Strain: Low Risk  (09/16/2023)   Overall Financial Resource Strain (CARDIA)    Difficulty of Paying Living Expenses: Not hard at all  Food Insecurity: No Food Insecurity (09/16/2023)   Hunger Vital Sign    Worried About Running Out of Food in the Last Year: Never true    Ran Out of Food in the Last Year: Never true  Transportation Needs: No Transportation Needs (09/16/2023)   PRAPARE - Administrator, Civil Service (Medical): No    Lack of Transportation (Non-Medical): No  Physical Activity: Sufficiently Active (09/16/2023)   Exercise Vital Sign    Days of Exercise per Week: 4 days    Minutes of Exercise per Session: 40 min  Stress: No Stress Concern Present (09/16/2023)   [employer] of Occupational Health - Occupational Stress Questionnaire    Feeling of Stress: Only a little  Social Connections: Moderately Isolated (09/16/2023)   Social Connection and Isolation Panel    Frequency of Communication with Friends and Family: Three times a week    Frequency of Social Gatherings with Friends and Family: Once a week    Attends Religious Services: Never    Database administrator or Organizations: No    Attends Banker Meetings: Never    Marital Status: Married    Tobacco Counseling Counseling given: Not Answered Tobacco comments: Smoked off and on. Never much. socially    Clinical Intake:  Pre-visit preparation completed: Yes  Pain :  No/denies pain  Diabetes: No   How often do you need to have someone help you when you read instructions, pamphlets, or other written materials from your doctor or pharmacy?: 1 - Never  Interpreter Needed?: No  Information entered by :: Carly Bloodgood LPN   Activities of Daily Living     09/16/2023   12:00 PM  In your present state of health, do you have any difficulty performing the following activities:  Hearing? 0  Vision? 0  Difficulty concentrating or making decisions? 0  Walking or climbing stairs? 0  Dressing or bathing? 0  Doing errands, shopping? 0  Preparing Food and eating ?  N  Using the Toilet? N  In the past six months, have you accidently leaked urine? N  Do you have problems with loss of bowel control? N  Managing your Medications? N  Managing your Finances? N  Housekeeping or managing your Housekeeping? N    Patient Care Team: Aletha Bene, MD as PCP - General (Family Medicine)  I have updated your Care Teams any recent Medical Services you may have received from other providers in the past year.     Assessment:   This is a routine wellness examination for Carly Matthews.  Hearing/Vision screen Hearing Screening - Comments:: Denies hearing difficulties   Vision Screening - Comments:: Wears rx glasses - up to date with routine eye exams     Goals Addressed             This Visit's Progress    Maintain health and independence   On track      Depression Screen     09/16/2023   11:59 AM 06/29/2023    9:46 AM  PHQ 2/9 Scores  PHQ - 2 Score 0 0    Fall Risk     09/16/2023   12:00 PM 06/29/2023    9:46 AM  Fall Risk   Falls in the past year? 0 0  Number falls in past yr: 0 0  Injury with Fall? 0 0  Risk for fall due to : No Fall Risks   Follow up Falls prevention discussed;Education provided;Falls evaluation completed     MEDICARE RISK AT HOME:  Medicare Risk at Home Any stairs in or around the home?: No If so, are there any without  handrails?: No Home free of loose throw rugs in walkways, pet beds, electrical cords, etc?: Yes Adequate lighting in your home to reduce risk of falls?: Yes Life alert?: No Use of a cane, walker or w/c?: No Grab bars in the bathroom?: Yes Shower chair or bench in shower?: No Elevated toilet seat or a handicapped toilet?: Yes  TIMED UP AND GO:  Was the test performed?  No  Cognitive Function: Declined/Normal: No cognitive concerns noted by patient or family. Patient alert, oriented, able to answer questions appropriately and recall recent events. No signs of memory loss or confusion.        Immunizations Immunization History  Administered Date(s) Administered   Fluad Quad(high Dose 65+) 11/08/2020   Fluad Trivalent(High Dose 65+) 12/20/2022   Fluzone Influenza virus vaccine,trivalent (IIV3), split virus 01/09/2009, 03/06/2011   Influenza, High Dose Seasonal PF 01/23/2015, 12/29/2017, 11/24/2018, 11/26/2021   Influenza, Seasonal, Injecte, Preservative Fre 01/17/2010   Influenza,inj,Quad PF,6+ Mos 04/02/2012, 11/04/2012, 01/15/2016, 12/12/2016   Influenza-Unspecified 01/15/2016, 12/17/2017, 11/26/2021   PFIZER(Purple Top)SARS-COV-2 Vaccination 04/16/2019, 05/11/2019, 11/29/2019   PNEUMOCOCCAL CONJUGATE-20 01/15/2023   Pfizer Covid-19 Vaccine Bivalent Booster 55yrs & up 11/08/2020   Pfizer(Comirnaty)Fall Seasonal Vaccine 12 years and older 11/26/2021, 12/20/2022   Pneumococcal Conjugate-13 04/24/2015   Pneumococcal Polysaccharide-23 10/21/2013, 04/03/2020   Respiratory Syncytial Virus Vaccine,Recomb Aduvanted(Arexvy) 04/05/2022   Tdap 04/27/2007, 10/28/2021   Zoster Recombinant(Shingrix) 04/24/2015, 07/26/2020, 10/13/2020   Zoster, Live 03/06/2011, 04/24/2015    Screening Tests Health Maintenance  Topic Date Due   Hepatitis C Screening  Never done   COVID-19 Vaccine (7 - Pfizer risk 2024-25 season) 06/20/2023   INFLUENZA VACCINE  10/02/2023   Medicare Annual Wellness (AWV)   09/15/2024   MAMMOGRAM  03/23/2025   DTaP/Tdap/Td (3 - Td or Tdap) 10/29/2031   Colonoscopy  08/26/2033   Pneumococcal Vaccine: 50+  Years  Completed   DEXA SCAN  Completed   Zoster Vaccines- Shingrix  Completed   Hepatitis B Vaccines  Aged Out   HPV VACCINES  Aged Out   Meningococcal B Vaccine  Aged Out    Health Maintenance  Health Maintenance Due  Topic Date Due   Hepatitis C Screening  Never done   COVID-19 Vaccine (7 - Pfizer risk 2024-25 season) 06/20/2023    Additional Screening:  Vision Screening: Recommended annual ophthalmology exams for early detection of glaucoma and other disorders of the eye. Would you like a referral to an eye doctor? No    Dental Screening: Recommended annual dental exams for proper oral hygiene  Community Resource Referral / Chronic Care Management: CRR required this visit?  No   CCM required this visit?  No   Plan:    I have personally reviewed and noted the following in the patient's chart:   Medical and social history Use of alcohol, tobacco or illicit drugs  Current medications and supplements including opioid prescriptions. Patient is not currently taking opioid prescriptions. Functional ability and status Nutritional status Physical activity Advanced directives List of other physicians Hospitalizations, surgeries, and ER visits in previous 12 months Vitals Screenings to include cognitive, depression, and falls Referrals and appointments  In addition, I have reviewed and discussed with patient certain preventive protocols, quality metrics, and best practice recommendations. A written personalized care plan for preventive services as well as general preventive health recommendations were provided to patient.   Lavelle Pfeiffer [place], [place]   2/83/7974   After Visit Summary: (MyChart) Due to this being a telephonic visit, the after visit summary with patients personalized plan was offered to patient via MyChart   Notes:  Nothing significant to report at this time.

## 2023-09-22 DIAGNOSIS — C4371 Malignant melanoma of right lower limb, including hip: Secondary | ICD-10-CM | POA: Diagnosis not present

## 2023-09-22 DIAGNOSIS — M8580 Other specified disorders of bone density and structure, unspecified site: Secondary | ICD-10-CM | POA: Diagnosis not present

## 2023-09-23 ENCOUNTER — Encounter: Payer: Self-pay | Admitting: Family Medicine

## 2023-09-23 DIAGNOSIS — K642 Third degree hemorrhoids: Secondary | ICD-10-CM | POA: Diagnosis not present

## 2023-09-23 LAB — LAB REPORT - SCANNED: EGFR: 96

## 2023-10-12 ENCOUNTER — Other Ambulatory Visit: Payer: Self-pay

## 2023-10-12 ENCOUNTER — Telehealth: Payer: Self-pay | Admitting: Family Medicine

## 2023-10-12 DIAGNOSIS — K219 Gastro-esophageal reflux disease without esophagitis: Secondary | ICD-10-CM

## 2023-10-12 MED ORDER — PANTOPRAZOLE SODIUM 40 MG PO TBEC: 40.0000 mg | DELAYED_RELEASE_TABLET | Freq: Every day | ORAL | 1 refills | Status: AC

## 2023-10-12 NOTE — Telephone Encounter (Signed)
 Prescription Request  10/12/2023  LOV: 06/29/2023  What is the name of the medication or equipment?   pantoprazole  (PROTONIX ) 40 MG tablet  **90 day script requested**  Have you contacted your pharmacy to request a refill? Yes   Which pharmacy would you like this sent to?  [facility] [id number] - [place], [place] - [address] [place] [address] Phone: [phone] Fax: [phone]    Patient notified that their request is being sent to the clinical staff for review and that they should receive a response within 2 business days.   Please advise pharmacist.

## 2023-10-20 DIAGNOSIS — Z96651 Presence of right artificial knee joint: Secondary | ICD-10-CM | POA: Diagnosis not present

## 2023-10-20 DIAGNOSIS — M170 Bilateral primary osteoarthritis of knee: Secondary | ICD-10-CM | POA: Diagnosis not present

## 2023-10-20 DIAGNOSIS — M1711 Unilateral primary osteoarthritis, right knee: Secondary | ICD-10-CM | POA: Diagnosis not present

## 2023-10-20 DIAGNOSIS — Z471 Aftercare following joint replacement surgery: Secondary | ICD-10-CM | POA: Diagnosis not present

## 2023-11-04 DIAGNOSIS — R4189 Other symptoms and signs involving cognitive functions and awareness: Secondary | ICD-10-CM | POA: Diagnosis not present

## 2023-12-11 DIAGNOSIS — E782 Mixed hyperlipidemia: Secondary | ICD-10-CM | POA: Diagnosis not present

## 2023-12-11 DIAGNOSIS — K625 Hemorrhage of anus and rectum: Secondary | ICD-10-CM | POA: Diagnosis not present

## 2023-12-11 DIAGNOSIS — I10 Essential (primary) hypertension: Secondary | ICD-10-CM | POA: Diagnosis not present

## 2023-12-11 DIAGNOSIS — R4781 Slurred speech: Secondary | ICD-10-CM | POA: Diagnosis not present

## 2023-12-11 DIAGNOSIS — I6602 Occlusion and stenosis of left middle cerebral artery: Secondary | ICD-10-CM | POA: Diagnosis not present

## 2023-12-11 DIAGNOSIS — K649 Unspecified hemorrhoids: Secondary | ICD-10-CM | POA: Diagnosis not present

## 2023-12-11 DIAGNOSIS — I959 Hypotension, unspecified: Secondary | ICD-10-CM | POA: Diagnosis not present

## 2023-12-11 DIAGNOSIS — Z96651 Presence of right artificial knee joint: Secondary | ICD-10-CM | POA: Diagnosis not present

## 2023-12-11 DIAGNOSIS — R479 Unspecified speech disturbances: Secondary | ICD-10-CM | POA: Diagnosis not present

## 2023-12-11 DIAGNOSIS — I517 Cardiomegaly: Secondary | ICD-10-CM | POA: Diagnosis not present

## 2023-12-11 DIAGNOSIS — R079 Chest pain, unspecified: Secondary | ICD-10-CM | POA: Diagnosis not present

## 2023-12-11 DIAGNOSIS — F32A Depression, unspecified: Secondary | ICD-10-CM | POA: Diagnosis not present

## 2023-12-11 DIAGNOSIS — Z87891 Personal history of nicotine dependence: Secondary | ICD-10-CM | POA: Diagnosis not present

## 2023-12-11 DIAGNOSIS — R29810 Facial weakness: Secondary | ICD-10-CM | POA: Diagnosis not present

## 2023-12-11 DIAGNOSIS — G458 Other transient cerebral ischemic attacks and related syndromes: Secondary | ICD-10-CM | POA: Diagnosis not present

## 2023-12-11 DIAGNOSIS — G459 Transient cerebral ischemic attack, unspecified: Secondary | ICD-10-CM | POA: Diagnosis not present

## 2023-12-11 DIAGNOSIS — I6389 Other cerebral infarction: Secondary | ICD-10-CM | POA: Diagnosis not present

## 2023-12-11 DIAGNOSIS — L400 Psoriasis vulgaris: Secondary | ICD-10-CM | POA: Diagnosis not present

## 2023-12-11 DIAGNOSIS — F419 Anxiety disorder, unspecified: Secondary | ICD-10-CM | POA: Diagnosis not present

## 2023-12-11 DIAGNOSIS — Z85820 Personal history of malignant melanoma of skin: Secondary | ICD-10-CM | POA: Diagnosis not present

## 2023-12-11 DIAGNOSIS — K219 Gastro-esophageal reflux disease without esophagitis: Secondary | ICD-10-CM | POA: Diagnosis not present

## 2023-12-11 DIAGNOSIS — R200 Anesthesia of skin: Secondary | ICD-10-CM | POA: Diagnosis not present

## 2023-12-11 DIAGNOSIS — R0789 Other chest pain: Secondary | ICD-10-CM | POA: Diagnosis not present

## 2023-12-11 DIAGNOSIS — Z9889 Other specified postprocedural states: Secondary | ICD-10-CM | POA: Diagnosis not present

## 2023-12-11 DIAGNOSIS — R457 State of emotional shock and stress, unspecified: Secondary | ICD-10-CM | POA: Diagnosis not present

## 2023-12-11 DIAGNOSIS — K589 Irritable bowel syndrome without diarrhea: Secondary | ICD-10-CM | POA: Diagnosis not present

## 2023-12-11 DIAGNOSIS — I669 Occlusion and stenosis of unspecified cerebral artery: Secondary | ICD-10-CM | POA: Diagnosis not present

## 2023-12-11 DIAGNOSIS — I63312 Cerebral infarction due to thrombosis of left middle cerebral artery: Secondary | ICD-10-CM | POA: Diagnosis not present

## 2023-12-11 DIAGNOSIS — R29709 NIHSS score 9: Secondary | ICD-10-CM | POA: Diagnosis not present

## 2023-12-11 DIAGNOSIS — Z79631 Long term (current) use of antimetabolite agent: Secondary | ICD-10-CM | POA: Diagnosis not present

## 2023-12-11 DIAGNOSIS — I63512 Cerebral infarction due to unspecified occlusion or stenosis of left middle cerebral artery: Secondary | ICD-10-CM | POA: Diagnosis not present

## 2023-12-11 DIAGNOSIS — I639 Cerebral infarction, unspecified: Secondary | ICD-10-CM | POA: Diagnosis not present

## 2023-12-11 DIAGNOSIS — R4701 Aphasia: Secondary | ICD-10-CM | POA: Diagnosis not present

## 2023-12-11 DIAGNOSIS — R2990 Unspecified symptoms and signs involving the nervous system: Secondary | ICD-10-CM | POA: Diagnosis not present

## 2023-12-11 DIAGNOSIS — R29701 NIHSS score 1: Secondary | ICD-10-CM | POA: Diagnosis not present

## 2023-12-11 DIAGNOSIS — G969 Disorder of central nervous system, unspecified: Secondary | ICD-10-CM | POA: Diagnosis not present

## 2023-12-11 DIAGNOSIS — Z79899 Other long term (current) drug therapy: Secondary | ICD-10-CM | POA: Diagnosis not present

## 2023-12-11 DIAGNOSIS — L409 Psoriasis, unspecified: Secondary | ICD-10-CM | POA: Diagnosis not present

## 2023-12-12 NOTE — Anesthesia Postprocedure Evaluation (Signed)
 Patient: Carly Matthews  Procedure Summary     Date: 12/12/23 Room / Location: [facility] - INTERVENTIONAL RADIOLOGY RT   Anesthesia Start: 1146 Anesthesia Stop: 1321   Procedure: IR VASCULAR THROMBECTOMY  Diagnosis:    Scheduled Providers: Belma Coffer, MD Responsible Provider: Glendia Dale Pinal, MD   Anesthesia Type: general ASA Status: 4 - Emergent       Anesthesia Type: general  Vitals Value Taken Time  BP 183/54 12/12/23 16:00  Temp  12/12/23 16:50  Pulse 62 12/12/23 16:48  Resp 21 12/12/23 16:48  SpO2 98 % 12/12/23 16:48  Vitals shown include unfiled device data.  There were no known notable events for this encounter.  Anesthesia Post Evaluation  Final anesthesia type: general Patient location during evaluation: PACU Patient participation: Patient participated Level of consciousness: awake and alert Pain score: pain well controlled (patient comfortable/resting) Pain management: minimal or mild pain initially, now adequately controlled Post-op nausea and vomiting?: none Post-op vital signs: post-procedure vital signs are stable Patient temperature: Normothermic Cardiovascular status: hemodynamically stable Respiratory status: Stable, room air, spontaneous Hydration status: stable with IV fluids Post-op disposition: ICU Anesthesia post-op complications?:no complications Comments: Blood pressure (!) 183/54, pulse 78, temperature 98.2 F (36.8 C), temperature source Oral, resp. rate 17, height 1.575 m (5' 2), weight 54.4 kg (119 lb 14.9 oz), SpO2 99%.

## 2023-12-12 NOTE — Anesthesia Preprocedure Evaluation (Signed)
 Patient: Carly [facility]  Procedure Information     Anesthesia Start Date/Time: 12/12/23 1146   Scheduled providers: Belma Coffer, MD   Procedure: IR VASCULAR THROMBECTOMY    Location: [facility] - INTERVENTIONAL RADIOLOGY RT       Relevant Problems  CARDIOVASCULAR  (+) Hypertension    GI  (+) Gastroesophageal reflux disease    Other  (+) Arthritis of right acromioclavicular joint  (+) Osteoarthritis, knee  (+) Primary osteoarthritis of left knee  (+) Primary osteoarthritis of right knee    BP (!) 183/54   Pulse 78   Temp 98.2 F (36.8 C) (Oral)   Resp 17   Ht 1.575 m (5' 2)   Wt 54.4 kg (119 lb 14.9 oz)   SpO2 99%   BMI 21.94 kg/m    Clinical information reviewed:  Tobacco  Allergies  Med Hx  Surg Hx  Fam Hx  Soc Hx     Anesthesia Evaluation  PONV Predictive Score (Scale 0-5):  Apfel risk score: 0   Physical Exam  Anesthesia Plan  Review Preop documentation reviewed: Preop Vitals Reviewed and Periop Tests and Results Reviewed Comments: By signing, I attest that I have identified and re-evaluated the patient immediately before the induction of anesthesia and I am satisfied that my anesthetic plan is suitable for the patient's condition and procedure. Any physical exam findings recorded in this note have been performed and confirmed by me. Other providers may have contributed to writing this note. I attest that I have confirmed and agree with all elements of this note unless I have specified otherwise. Please see Revision History to see contributions entered by other providers. Glendia Kandy Pinal, MD  Plan ASA score: 4 - emergent Anesthesia type: general Induction: intravenous Additional monitors/lines: arterial line Post-op plan: Extubation in OR, PACU and ICU Trial extubation  Informed Consent Anesthetic plan and risks discussed with patient.

## 2023-12-12 NOTE — Anesthesia Procedure Notes (Signed)
  Airway Date/Time: 12/12/2023 12:01 PM Reason: elective  Airway not difficult  General Information and Staff Patient location during procedure: NORA Performed: student  Student: Joesph Specking, SRNA  Indications and Patient Condition Indications for airway management: anesthesia Sedation level: deep    Preoxygenated: yesPatient position: sniffing  Mask difficulty assessment: 1 - vent by mask  Final Airway Details  Final airway type: endotracheal airway Successful airway: ETT Cuffed: yes  Successful intubation technique: video laryngoscopy - Glidescope Adjuncts used in placement: intubating stylet Endotracheal tube insertion site: oral Blade: GlideScope Blade size: #3 Tube size (mm): 7.0 Cormack-Lehane Classification: grade I - full view of glottis [place] VL View: F (full) [place] VL Ease: 1 - easy Placement verified by: chest auscultation, capnometry and palpation of cuff  Measured from: lips ETT to lips (cm): 22 Number of attempts at approach: 1 Ventilation between attempts: none Number of other approaches attempted: 0 Final Dental Condition: Teeth, lips, and tongue in pre-anesthetic condition Patient tolerated procedure well with no complications

## 2023-12-13 NOTE — Progress Notes (Signed)
 ------------------------------------------------------------------------------- Attestation signed by Bernardino Darleene Clas, MD at 12/13/2023  2:22 PM Neurocritical Care Attending  I have independently seen and examined the patient on the day of service. I have reviewed the medical record, diagnostic imaging, and laboratory studies. Plan of care developed reviewed with the multidisciplinary critical care team. Critical care services as documented in this note have been provided directly by the critical care APP under my supervision. I agree with the assessment and plan as documented in the attached note.  Ryan C. 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 LKN 1:00pm 10/10. NIHSS of 2. 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 She was transferred to [facility] for thrombectomy consideration.   On arrival to our ED, NIHSS had improved to 1 (continued aphasia/word finding difficulty). Decision made by neurology to load with ASA & Plavix and admit to Neuro ICU for close thrombectomy watch.  Overnight after arrival, pt developed new weakness in LUE. BP goal increased to SBP>175, levophed started & a-line placed, however exam did not consistently improve with raised pressure. She was taken to IR on 10/11 for thrombectomy and stent placement.  10/12: Aphasia and RUE weakness improving. MRI Brain pending. Plan to wean levophed off today.  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,POA,active -Initial NIHSS 2 -S/p thrombectomy & stent placement (10/11)  Depression -home duloxetine  60mg  -Q4hr neuro checks  -Continue ASA/Brilinta  -F/u MRI Brain  -Continue home duloxetine    -PT/OT   Pulm:  -Encourage IS, pulmonary hygiene  -Stable on Room Air   Cardio: Chronic HTN -home amlodipine  5mg   HLD -home atorvastatin  10mg   -Wean levophed, goal normotension  -Holding home amlodipine , restart when appropriate  -Continue home atorvastatin   -Echo with bubble study pending   Gastro: GERD -home protonix  40mg  -NPO pending SLP eval  -Bowel regimen per protocol  -Continue home PPI   FEN/Renal:  -ICU electrolyte replacement protocol  -Monitor I&O   ID:  -Monitor for s/s infection    Endo:  -Daily glucose monitoring   Hem/Onc:  -Daily CBC  -SCDs for mechanical DVT ppx   Lines: PIVs L-radial A-line -D/c Aline    Communication:  -Plan of care communicated to patient and family at bedside   Discharge planning:  -Remain in ICU; consider transfer to floor after levo weaned off   ROS: Pertinent positive and negative findings are listed as part of the history of present illness, all other systems were reviewed and are negative.   Objective: Vital signs in last 24 hours: Vitals: BP: (!) 168/71, Temp: 97.6 F (36.4 C), Temp Source: Oral, Heart Rate: 96, Resp: (!) 34, SpO2: 99 %;     Respiratory/Ventilator Data (if applicable): Room Air  Significant Labs or trend in Labs: All labs reviewed and noted above, otherwise unremarkable.    Allergies and current medications given over last 24 hours reviewed.   Physical Exam: GENERAL APPEARANCE: Lying in bed, NAD HEENT: Atraumatic NECK: Supple CARDIAC AND VASCULAR: NSR; No  murmur; extremities warm/dry RESPIRATORY: WOB unlabored, breath sounds clear/diminished, on room air ABDOMINAL & GASTROINTESTINAL: ABD soft, non-distended, BS  present GENITOURINARY: No foley NEUROLOGIC: A&O x3, follows commands -Pupils: 47mm/brisk -EOMs: Intact -Facial strength smile/grimace: Symmetric -Speech: Mild word-finding difficulty -Motor Strength: RUE 4/5, RLE 5/5, LHB 5/5   This plan of care was reviewed with and supervised by ICU Attending: Dr Verne  Electronically signed by:  Harlene Anette Kipper, NP, 12/13/2023 2:07 PM

## 2023-12-13 NOTE — Progress Notes (Signed)
-------------------------------------------------------------------------------   Attestation signed by Belma Coffer, MD at 12/13/2023  9:43 AM Patient is doing remarkably well this morning.  She is fully conversational, with no evidence of expressive aphasia was noted previously.  Her right upper extremity strength is now improved to 4 out of 5.  Recommend weaning pressors to ensure that her exam is not pressure dependent.  Continue baby aspirin  Brilinta daily for the next 6 months.  I have personally seen and examined the patient. I agree with the resident's assessment and plan as documented. -------------------------------------------------------------------------------  Neurosurgery Progress Note Hospital Day: 3   24hr Events: S/p left M1 stent placement yesterday.  Patient exam steadily improved overnight which seems to be independent of blood pressure.    Objective:  BP (!) 146/48   Pulse 80   Temp 98.4 F (36.9 C) (Oral)   Resp (!) 24   Ht 1.575 m (5' 2)   Wt 54.4 kg (119 lb 14.9 oz)   SpO2 98%   BMI 21.94 kg/m  Lab Results  Component Value Date/Time   HGB 12.2 (L) 12/12/2023 2143   WBC 7.70 12/12/2023 2143   PLT 307 12/12/2023 2143   NA 137 12/12/2023 2143   K 3.6 12/12/2023 2143   BUN 9 12/12/2023 2143   CREATININE 0.46 (L) 12/12/2023 2143    Imaging: No new neurosurgical imaging to review.  Physical Exam: Awake, alert, oriented x 4 Mild word finding difficulty Eyes open spontaneously PERRL/EOMI Face symmetric Tongue midline RUE 4/5 throughout Full strength in all other extremities No pronator drift  Sensation intact to light touch throughout Groin with almond sized hematoma R DP pulse 2+  Assessment: Carly Matthews is a 72 y.o. 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 She is s/p left M1 stent  placement and ASA/Plavix load.  Exam has improved.  Maximal neurosurgical intervention has been completed, further care should focus on medical management.  Plan: -Transient Ischemic Attack 2/2 middle cerebral artery stenosis: s/p stent placement; obtain MRI Brain to assess stroke burden -Blood Pressure Management: BP goal per primary team, wean vasopressor as able -Continue ASA/Plavix for stent -New Focal Neurologic Deficit: Assessment by physical and occupational therapy, treat with therapies, braces and assistive devices as indicated  Diagnosis Present on Admission: All the following conditions were discussed and contribute to the medical complexity of the patient - Depression, GERD, and HLD  Wean levo Maximal intervention has been done  Please send a secure chat to [facility] Neurosurgery Team A with questions or concerns about this patient.    To escalate to the back up resident: go to On Call Finder > [facility] Neurosurgery Admit Consult Team > Secondary or [facility] Resident.  Electronically signed by:  Dorise Debby Means, MD 12/13/2023 6:23 AM

## 2023-12-13 NOTE — Progress Notes (Signed)
 ------------------------------------------------------------------------------- Attestation signed by Dene Hazel Askew, MD PhD at 12/13/2023  4:29 PM I saw and evaluated the patient and reviewed the resident's note. I agree with the resident's findings and plan as documented in the note unless otherwise noted below.   I have personally reviewed all cerebrovascular imaging.  Dr. Dene Askew, MD, PhD Vascular Neurology Attending Department of Neurology [facility] 12/13/2023   -------------------------------------------------------------------------------  [facility] Progress Note Service: Stroke Faculty Author: Dr. Askew Resident Author: Hassell Jenkins Saba, MD Date of Admission: 12/11/2023    Assessment and Plan      Assessment: [name] is a 73 y.o.  female  with a history of IBD, GERD, hemorrhoids who presented with right facial droop and speech disturbance.  Symptoms are consistent with L MCA infarct, found to have critical short segment occlusion versus critical stenosis of L M1 segment. Exam worsened morning of 10/11, neurosurgery engaged for consideration of thrombectomy and rescue stent. Patient underwent thrombectomy and M1 stenting on 10/11.  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  CTP: 69 cc penumbra with no core   Cardiovascular Diagnostics Telemetry/EKG:  normal sinus rhythm TTE: pending    Routine Stroke Labs: LDL: 80  A1c: 5.2  Plan: - Telemetry with q1hr neurochecks and vitals - Weaning pressors with goal to slowly return towards normotension by  discharge - Imaging: - MRI brain w/o contrast: pending - TTE with bubble study to evaluate cardiac sources: pending - Secondary stroke prevention: Aspirin  81 mg daily and Brilinta 90 mg daily - Atorvastatin  10mg  - Therapy consults: Physical therapy, Occupational therapy, Speech therapy  PT: Can tolerate 3 hours of therapy, rehab facility   OT: Rehab facility  SLP: pending   Chronic medical conditions: #GERD: Continue home protonix  #Mood: Continue home duloxetine        Subjective   No acute events overnight. Patient states she is feeling better this morning and is happy that he speech has improved a lot. She also feels like her right arm is getting stronger as well. Denies any new symptoms.       Objective      Temp:  [98.2 F (36.8 C)-98.9 F (37.2 C)] 98.4 F (36.9 C) Heart Rate:  [59-101] 80 Resp:  [10-34] 24 BP: (126-193)/(48-101) 146/48 Arterial Line BP: (132-200)/(60-83) 132/65   MENTAL STATUS EXAM: Orientation: Alert and oriented to person, place, time and situation Memory: Cooperative, follows commands well. Recent and remote memory normal. Attention, concentration: Attention span and concentration are normal. Language: Speech is much improved from yesterday Fund of knowledge: Aware of current events, vocabulary appropriate for patient age.   CRANIAL NERVES: CN 2 (Optic): Visual fields intact to confrontation.  CN 3,4,6 (EOM): Pupils equal and reactive to light. Full extraocular eye movement CN 5 (Trigeminal): Facial sensation is normal. No weakness of masticatory muscles. CN 7 (Facial): Improved right sided facial droop CN 8 (Auditory): Auditory acuity grossly normal. CN 9,10 (Glossophar): The uvula is midline, the palate elevates symmetrically. CN 11 (spinal access): Normal sternocleidomastoid and trapezius strength. CN 12 (Hypoglossal): The tongue is midline. No atrophy or fasciculations.   MOTOR: Muscle Strength: 5/5  in LHB, 4/5 in RHB with drift Muscle  Tone: Tone and muscle bulk are normal in the upper and lower extremities.   REFLEXES: Deferred  COORDINATION: Deferred  SENSATION: Decreased sensation on RUE compared to LUE   GAIT: Deferred      Diagnostic Studies   Recent pertinent labs reviewed:     The primary stroke work up will be documented in the assessment and plan.

## 2023-12-14 NOTE — Progress Notes (Signed)
    Division of Pharmacy Services  Medication History Completion Note  Name/DOB/Age of Patient: Carly Matthews / May 11, 1950 / 73 y.o.  Location: [facility] NEURO  Type: Hospital admission & Modality: Phone  Confirmed two patient identifiers: Yes  Confirmed patient is alert & oriented: Yes  Medication History Source (Med History Informants):  Patient Dispense History   Asked about any missing medications (such as pumps, injectable meds, TPN, OTC, etc): Yes  PTA Med List:  Prior to Admission Medications     Reviewed by Lorn Nephew, CPhT on 12/14/23 at 1050    Medication Sig Last Dose Informant Taking? Status  amLODIPine  (NORVASC ) 5 mg tablet Take 1 tablet (5 mg total) by mouth daily. Past Week Self, Other Yes Active         Med Note LYNWOOD, JEMIYA M   Fri Dec 11, 2023  2:43 PM) 10/10/2023 5 mg tab (disp 90, 90d supply)  atorvastatin  (LIPITOR) 10 mg tablet Take 1 tablet (10 mg total) by mouth at bedtime. Past Week Self, Other Yes Active         Med Note LYNWOOD, GLENNIS HERO   Fri Dec 11, 2023  2:44 PM) 10/10/2023 10 mg tab (disp 90, 90d supply)  DULoxetine  (CYMBALTA ) 60 mg capsule TAKE 1 CAPSULE BY MOUTH DAILY Past Week Self, Other Yes Active         Med Note LYNWOOD, JEMIYA M   Fri Dec 11, 2023  2:44 PM) 09/30/2023 60 mg cpDR (disp 90, 90d supply)  FOLIC ACID ORAL Take 1 tablet by mouth daily. Past Week Self Yes Active         Med Note AZUCENA, [facility]   Mon Dec 14, 2023 10:47 AM) OTC-unknown strength  methotrexate 2.5 mg tablet Take 15 mg by mouth once a week. in divided doses (three tabs (7.5mg ) every morning and evening) Past Week Self, Other Yes Active         Med Note LYNWOOD, JEMIYA M   Fri Dec 11, 2023  2:44 PM) 11/16/2023 2.5 mg tab (disp 72, 84d supply)  multivitamin (THERAGRAN) tab tablet Take 1 tablet by mouth daily. On hold for surgery 09/03/22 Past Week Self Yes Active  pantoprazole  (PROTONIX ) 40 mg EC tablet Take 1 tablet (40 mg total) by mouth daily. Past Week Self, Other  Yes Active         Med Note LYNWOOD, JEMIYA M   Fri Dec 11, 2023  2:44 PM) 10/12/2023 40 mg TbEC (disp 90, 90d supply)           Audit from Redirected Encounters   **Prior to Admission medications have not yet been reviewed for this encounter**     Selected Pharmacy:  [facility] [id number] - [place], [place] - [address] - PHONE: [phone] - FAX: [phone]  Comments: See PTA med list notes  Medications reconciled by provider: No  All medications and allergies were verified by Pt who is a pretty good historian and last fills/doses were verified by DrFirst.   DPS DECLINED Removed: Tylenol Biotin 5mg  Calcium  + D  Electronically signed by: Lorn Nephew, CPhT 12/14/2023 10:50 AM  Signature/Co-signature, if required: Lorn Nephew, CPhT   Date/Time: 12/14/2023 10:53 AM

## 2023-12-14 NOTE — Progress Notes (Signed)
 ------------------------------------------------------------------------------- Attestation signed by Channing Augusta, MD at 12/14/2023  2:53 PM I saw and evaluated the patient. I reviewed the trainee's note and agree. I performed the service or was physically present during the critical or key portions of the service furnished by the trainee; and I managed the patient.  Channing Augusta, MD 12/14/2023  -------------------------------------------------------------------------------  [facility] Progress Note Service: Stroke Faculty Author: Dr. Augusta Resident Author: Hassell Jenkins Saba, MD Date of Admission: 12/11/2023    Assessment and Plan      Assessment: [name] is a 73 y.o.  female  with a history of IBD, GERD, hemorrhoids who presented with right facial droop and speech disturbance.  Symptoms are consistent with L MCA infarct, found to have critical short segment occlusion versus critical stenosis of L M1 segment. Exam worsened morning of 10/11, neurosurgery engaged for consideration of thrombectomy and rescue stent. Patient underwent thrombectomy and M1 stenting on 10/11.  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 A few foci of petechial hemorrhage without a space occupying hematoma.   Vessel Imaging - CTA: Critical stenosis of left MCA, M1 segment - CT angiography data was processed via RAPID LVO AI technology to assist stroke clinical decision making with regards to the diagnosis of large vessel occlusion.  CTP: 69 cc penumbra with no core   Cardiovascular Diagnostics Telemetry/EKG:  normal sinus rhythm TTE: pending    Routine Stroke Labs: LDL: 80  A1c: 5.2  Plan: -  Telemetry with q4 hr neurochecks and vitals - Imaging: - MRI brain w/o contrast: pending - TTE with bubble study to evaluate cardiac sources: pending - Secondary stroke prevention: Aspirin  81 mg daily and Brilinta 90 mg daily, Atorvastatin  10mg  - Therapy consults: Physical therapy, Occupational therapy, Speech therapy  PT: Can tolerate 3 hours of therapy, rehab facility   OT: Can tolerate 3 hours of therapy, rehab facility  SLP: regular diet with thin liquids - Rehab consult:  Patient is aware that she has skilled for IPR, but she does not really want to do this. She feels that she is strong enough to go home and will pretty much be able to receive 24 hour supervision from her husband / other family. She states that she was able to walk to the bathroom this morning by herself, so she feels she can do the same at home as well. Engaged PT/OT this morning to see if they could come reevaluate the patient and her therapy needs. IPR is continuing to follow.   Chronic medical conditions: #GERD: Continue home protonix  #Mood: Continue home duloxetine        Subjective  No acute events overnight. This morning, patient states she is feeling well overall. She is happy with how much her speech has improved over the past few days. Denies any new weakness, numbness, tingling. Also denies any new symptoms. She is aware that she has been skilled for IPR but really does not think this is necessary and would rather go home. Ongoing discussions between the patient / family, PT/OT about the best dispo plan for this patient.       Objective      Temp:  [96.9 F (36.1 C)-98.2 F (36.8 C)] 97.8 F (36.6 C)  Heart Rate:  [59-100] 77 Resp:  [12-34] 14 BP: (84-168)/(39-93) 113/57 Arterial Line BP: (108-186)/(52-77) 118/67   MENTAL STATUS EXAM: Orientation: Alert and oriented to person, place, time and situation Memory: Cooperative, follows commands well. Recent and remote memory normal. Attention,  concentration: Attention span and concentration are normal. Language: Speech is much improved Fund of knowledge: Aware of current events, vocabulary appropriate for patient age.   CRANIAL NERVES: CN 2 (Optic): Visual fields intact to confrontation.  CN 3,4,6 (EOM): Pupils equal and reactive to light. Full extraocular eye movement CN 5 (Trigeminal): Facial sensation is normal. No weakness of masticatory muscles. CN 7 (Facial): Improved right sided facial droop CN 8 (Auditory): Auditory acuity grossly normal. CN 9,10 (Glossophar): The uvula is midline, the palate elevates symmetrically. CN 11 (spinal access): Normal sternocleidomastoid and trapezius strength. CN 12 (Hypoglossal): The tongue is midline. No atrophy or fasciculations.   MOTOR: Muscle Strength: 5/5 in LHB, 4/5 in RHB    REFLEXES: Deferred   COORDINATION: Deferred  SENSATION: Improved sensation in the RUE compared to prior days  GAIT: Deferred    Diagnostic Studies   Recent pertinent labs reviewed:  CBC, BMP from 10/12: largely unremarkable   The primary stroke work up will be documented in the assessment and plan.

## 2023-12-15 DIAGNOSIS — I639 Cerebral infarction, unspecified: Secondary | ICD-10-CM | POA: Insufficient documentation

## 2023-12-16 ENCOUNTER — Telehealth: Payer: Self-pay | Admitting: *Deleted

## 2023-12-16 NOTE — Transitions of Care (Post Inpatient/ED Visit) (Signed)
 12/16/2023  Name: Carly Matthews MRN: [id number] DOB: 04-May-1950  Today's TOC FU Call Status: Today's TOC FU Call Status:: Successful TOC FU Call Completed TOC FU Call Complete Date: 12/16/23 Patient's Name and Date of Birth confirmed.  Transition Care Management Follow-up Telephone Call Date of Discharge: 12/15/23 Discharge Facility: Other [facility]) Name of Other (Non-[facility]) Discharge Facility: [facility] Type of Discharge: Inpatient Admission Primary Inpatient Discharge Diagnosis:: CVA How have you been since you were released from the hospital?:  (eating, drinking well, ambulating without difficulty) Any questions or concerns?: No  Items Reviewed: Did you receive and understand the discharge instructions provided?: Yes Medications obtained,verified, and reconciled?: Yes (Medications Reviewed) Any new allergies since your discharge?: No Dietary orders reviewed?: Yes Type of Diet Ordered:: heart healthy Do you have support at home?: Yes People in Home [RPT]: spouse Name of Support/Comfort Primary Source: does not provide name of spouse Reviewed signs /symptoms CVA, reportable signs / symptoms Spouse has contact # for neurologist, will call to schedule appointment  Medications Reviewed Today: Medications Reviewed Today     Reviewed by Aura Mliss LABOR, RN (Registered Nurse) on 12/16/23 at 1354  Med List Status: <None>   Medication Order Taking? Sig Documenting Provider Last Dose Status Informant  amLODipine  (NORVASC ) 5 MG tablet [id number] Yes Take 1 tablet (5 mg total) by mouth daily. Aletha Bene, MD  Active   atorvastatin  (LIPITOR) 10 MG tablet [id number] Yes Take 1 tablet (10 mg total) by mouth daily. Aletha Bene, MD  Active   Calcium  Carb-Cholecalciferol (OYSTER SHELL CALCIUM  W/D) 500-5 MG-MCG TABS [id number] Yes Take by mouth. [provider]  Active   DULoxetine  (CYMBALTA ) 60 MG capsule [id number] Yes Take 1 capsule (60 mg  total) by mouth daily. Aletha Bene, MD  Active   folic acid (FOLVITE) 1 MG tablet [id number] Yes Take 1 mg by mouth daily. [provider]  Active   methotrexate (RHEUMATREX) 2.5 MG tablet [id number] Yes Take 15 mg by mouth once a week. [provider]  Active   Multiple Vitamin (MULTI-VITAMIN) tablet [id number] Yes Take 1 tablet by mouth daily. [provider]  Active   pantoprazole  (PROTONIX ) 40 MG tablet [id number] Yes Take 1 tablet (40 mg total) by mouth daily. Aletha Bene, MD  Active             Home Care and Equipment/Supplies: Were Home Health Services Ordered?: No Any new equipment or medical supplies ordered?: No  Functional Questionnaire: Do you need assistance with bathing/showering or dressing?: No Do you need assistance with meal preparation?: No Do you need assistance with eating?: No Do you have difficulty maintaining continence: No Do you need assistance with getting out of bed/getting out of a chair/moving?: No Do you have difficulty managing or taking your medications?: No  Follow up appointments reviewed: PCP Follow-up appointment confirmed?: Yes Date of PCP follow-up appointment?: 12/29/23 Follow-up Provider: Bene Aletha (pt has contact # for neurology, spouse will call to schedule) Specialist Hospital Follow-up appointment confirmed?: No Reason Specialist Follow-Up Not Confirmed: Patient has Specialist Provider Number and will Call for Appointment Do you need transportation to your follow-up appointment?: No Do you understand care options if your condition(s) worsen?: Yes-patient verbalized understanding  SDOH Interventions Today    Flowsheet Row Most Recent Value  SDOH Interventions   Food Insecurity Interventions Intervention Not Indicated  Housing Interventions Intervention Not Indicated  Transportation Interventions Intervention Not Indicated  Utilities Interventions Intervention Not Indicated    Mliss Aura  RNC,  BSN RN Care Manager/ Transition of Care [place]/ [facility] Population Health [phone]

## 2023-12-21 DIAGNOSIS — R4701 Aphasia: Secondary | ICD-10-CM | POA: Diagnosis not present

## 2023-12-21 DIAGNOSIS — I63512 Cerebral infarction due to unspecified occlusion or stenosis of left middle cerebral artery: Secondary | ICD-10-CM | POA: Diagnosis not present

## 2023-12-28 ENCOUNTER — Other Ambulatory Visit: Payer: Self-pay | Admitting: Family Medicine

## 2023-12-28 DIAGNOSIS — I63512 Cerebral infarction due to unspecified occlusion or stenosis of left middle cerebral artery: Secondary | ICD-10-CM | POA: Diagnosis not present

## 2023-12-28 DIAGNOSIS — R4701 Aphasia: Secondary | ICD-10-CM | POA: Diagnosis not present

## 2023-12-28 DIAGNOSIS — F419 Anxiety disorder, unspecified: Secondary | ICD-10-CM

## 2023-12-28 DIAGNOSIS — M179 Osteoarthritis of knee, unspecified: Secondary | ICD-10-CM

## 2023-12-29 ENCOUNTER — Encounter: Payer: Self-pay | Admitting: Family Medicine

## 2023-12-29 ENCOUNTER — Ambulatory Visit: Admitting: Family Medicine

## 2023-12-29 VITALS — BP 124/78 | HR 63 | Temp 98.8°F | Ht 61.0 in | Wt 107.1 lb

## 2023-12-29 DIAGNOSIS — F419 Anxiety disorder, unspecified: Secondary | ICD-10-CM

## 2023-12-29 DIAGNOSIS — I63312 Cerebral infarction due to thrombosis of left middle cerebral artery: Secondary | ICD-10-CM

## 2023-12-29 DIAGNOSIS — I6930 Unspecified sequelae of cerebral infarction: Secondary | ICD-10-CM

## 2023-12-29 DIAGNOSIS — R4701 Aphasia: Secondary | ICD-10-CM | POA: Diagnosis not present

## 2023-12-29 DIAGNOSIS — E785 Hyperlipidemia, unspecified: Secondary | ICD-10-CM

## 2023-12-29 DIAGNOSIS — M8580 Other specified disorders of bone density and structure, unspecified site: Secondary | ICD-10-CM

## 2023-12-29 DIAGNOSIS — I10 Essential (primary) hypertension: Secondary | ICD-10-CM

## 2023-12-29 MED ORDER — ATORVASTATIN CALCIUM 20 MG PO TABS: 20.0000 mg | ORAL_TABLET | Freq: Every day | ORAL | 3 refills | Status: AC

## 2023-12-29 MED ORDER — TICAGRELOR 90 MG PO TABS
90.0000 mg | ORAL_TABLET | ORAL | 0 refills | Status: DC
Start: 2023-12-29 — End: 2023-12-29

## 2023-12-29 MED ORDER — BRILINTA 90 MG PO TABS: 90.0000 mg | ORAL_TABLET | Freq: Every day | ORAL | 0 refills | Status: AC

## 2023-12-29 NOTE — Progress Notes (Signed)
 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 Per Neurology  Dispense: 90 tablet; Refill: 0  Anxiety -     TSH  Aphasia  Primary hypertension -     CBC with Differential/Platelet -     Comprehensive metabolic panel with GFR  Hyperlipidemia, unspecified hyperlipidemia type -     Atorvastatin  Calcium ; Take 1 tablet (20 mg total) by mouth daily.  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 Speech and memory affected, but strength in arms and legs has improved. Undergoing speech therapy once a week. Intracranial thrombectomy and stent placement on December 12, 2023, for cerebral artery thrombus. Bruising with Brilinta use. - Continue speech therapy once a week. - Continue Brilinta 90 mg  for six months. - Continue aspirin  in the morning. - Monitor for bruising due to Brilinta. - Follow up with neurologist on January 12, 2024.  Hyperlipidemia Cholesterol levels require management post-stroke. LDL is above target at 80 mg/dL, with a goal of under 70 mg/dL. - Increase Lipitor to 20 mg daily. - Recheck cholesterol levels in one month.  General Health Maintenance Flu and COVID vaccinations are up to date. Weight loss possibly due to decreased appetite post-stroke. - Ensure vaccinations remain up to date. - Monitor weight and appetite.     Test results were reviewed and analyzed as part of the medical decision making of  this visit.  Reviewed notes/consults/ results i.e. laboratory, imaging studies and hospital notes from recent hospitalization at [facility].  Increase Lipitor to 20 mg once a day to achieve LDL less than 70.  Patient will continue other medications including the aspirin  and Brilinta.  Per neurology the Brilinta will be given through April of next year..  Return in 1 month or sooner if necessary.  Patient will continue with speech therapy  Return in about 4 weeks (around 01/26/2024), or if symptoms worsen or fail to improve.   Subjective:    Patient ID: Kegan Shepardson, female    DOB: [birth year]  Age: 73 y.o. MRN: [id number]  Chief Complaint  Patient presents with   Hospitalization Follow-up    HPI Discussed the use of AI scribe software for clinical note transcription with the patient, who gave verbal consent to proceed.  History of Present Illness        Soriah Leeman is a 73 year old female with ischemic stroke who presents for hospital follow-up regarding her recovery from stroke and medication management.  She experienced an ischemic stroke on December 11, 2023, while at work, leading to hospitalization in [place]. She was initially admitted to the intensive care unit and later transferred to a regular floor, staying in the hospital until December 16, 2023. The stroke primarily affected her speech and memory, and she is currently undergoing speech therapy once a week, which she finds beneficial. Initially, she had right-sided weakness, particularly in her right arm/leg, but her strength has improved, and  her balance is stable. Patient wants her memory to return to her baseline  She is on several medications, including blood pressure medication, Lipitor for cholesterol, aspirin , methotrexate Rx by Dermatologist, and Brilinta either once or twice daily following a stent placement during an intracranial thrombectomy on December 12, 2023. She notes bruising as a side effect of  Brilinta. She has also received a flu shot and a COVID-19 vaccine at [facility] this month  Her appetite has decreased slightly, and she has lost some weight. She reports good sleep, including taking naps, which is new for her. No stress or anxiety is reported, and she has bruising as a side effect of her current medications.  Physical Exam CHEST: Lungs clear to auscultation. NEUROLOGICAL: Right leg strength normal. Upper extremity strength normal. Gait normal.  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 Speech and memory affected, but strength in arms and legs has improved. Undergoing speech therapy once a week. Intracranial thrombectomy and stent placement on December 12, 2023, for cerebral artery thrombus. Bruising with Brilinta  and ASA use. - Continue speech therapy once a week. - Continue Brilinta 90 mg  for six months. - Continue aspirin  in the morning. - Monitor for bruising due to Brilinta. - Follow up with neurologist on January 12, 2024. HTN-using antihypertensive medication without difficulty. Patient continues to take Norvasc  5mg  once per day. Patient no longer smokes  Hyperlipidemia Cholesterol levels require management post-stroke. LDL is above target at 80 mg/dL, with a goal of under 70 mg/dL. - Increase Lipitor to 20 mg daily. - Recheck cholesterol levels in one month.  General Health Maintenance Flu and COVID vaccinations are up to date. Weight loss possibly due to decreased appetite post-stroke. - Ensure vaccinations remain up to  date. - Monitor weight and appetite. IMPRESSION: Acute to early subacute left MCA territory infarcts, primarily involving the corpus striatum. There is associated petechial hemorrhage without space-occupying hematoma or substantial mass effect. Exam End: 12/14/23 01:19    Last known normal was at 1300 on 10/10. Initial symptoms included right facial weakness and difficulty expressing her words. En route with EMS, her symptoms resolved spontaneously. On arrival to OSH, the patient was found to have a NIHSS of 2 due to facial asymmetry and loss of fluency; upon re-evaluation, her NIHSS was 0. CT code stroke imaging demonstrated left MCA high-grade stenosis versus occlusion with resulting perfusion disturbance. Given low NIHSS and fluctuating symptoms, patient was transferred to [facility] for close monitoring.   The patient was not a candidate for IV thrombolytics due to W6: Stroke severity too mild (non-disabling) (fluctuating symptoms) The patient was not a candidate for mechanical thrombectomy due to NIHSS <6  Subsequently, she was admitted to the inpatient neurology service for further management and evaluation in the NeuroICU for close monitoring. She was loaded with DAPT in lieu of TNK given non-disabling stroke with low NIHSS per ARAMIS trial. The night of 10/10, she continued to have fluctuating symptoms. Neurosurgery called, however given improvement in her symptoms she was not a candidate for intervention. She was started on levophed for MAP pushes. Additionally, she was switched from Plavix to Brilinta while staying  on Aspirin  81mg . Repeat CTA H/N demonstrated similar findings. On 10/11, she had persistent NIHSS 6 for right sided weakness, decreased sensation, and worse aphasia; NSGY re-engaged, placed L M1 stent on 10/11. Patient tolerated the procedure well and her examination improved.   Given workup, the patient's presenting symptoms were felt to be most likely secondary to left MCA CVA which  was likely due to ICAD source. On discharge, the patient had an mRS of 3 = Moderate disability; requiring some help, but able to walk without assistance. The patient was started on ASA 81mg  indefinitely and Brilinta 90mg  for 90 days for future stroke prevention which should be continued upon discharge. While in house the patient worked with PT/OT; initially recommended IPR, however patient continued to improve and she was later re-skilled to outpatient PT/OT. The patient also worked with SLP and was cleared for a regular diet with thin liquids. On Tue 12/15/2023, the patient was deemed medically stable for discharge from the hospital to Home with close PCP, neurology, neurosurgery follow up.  CHRONIC MEDICAL CONDITIONS The patient was restarted on all home medications other than stated changes in this discharge summary. #GERD: Continue home protonix  #Mood: Continue home duloxetine   Consults:  NeuroSurg PT OT SLT  Patient's Ordered Code Status:  full code  Received IV thrombolytics? IV Thrombolytics Given?: No  Disposition:  Disposition: Home  Goals of Care:  Were goals of care discussed?: Yes, discussed or attempted to discuss  Objective   Vitals:  12/15/23 1129  BP: (!) 117/54  Pulse: 86  Resp: 15  Temp: 97.7 F (36.5 C)  SpO2: 100%   Physical exam: (performed by Dr. Jerrye) MENTAL STATUS EXAM: Orientation: Alert and oriented to person, place, time and situation Memory: Cooperative, follows commands well. Recent and remote memory normal. Attention, concentration: Attention span and concentration are normal. Language: Speech is much improved Fund of knowledge: Aware of current events, vocabulary appropriate for patient age.   CRANIAL NERVES: CN 2 (Optic): Visual fields intact to confrontation.  CN 3,4,6 (EOM): Pupils equal and reactive to light. Full extraocular eye movement CN 5 (Trigeminal): Facial sensation is normal. No weakness of masticatory muscles. CN 7  (Facial): Improved right sided facial droop CN 8 (Auditory): Auditory acuity grossly normal. CN 9,10 (Glossophar): The uvula is midline, the palate elevates symmetrically. CN 11 (spinal access): Normal sternocleidomastoid and trapezius strength. CN 12 (Hypoglossal): The tongue is midline. No atrophy or fasciculations.  MOTOR: Muscle Strength: 5/5 in LHB, 4/5 in RHB    REFLEXES: Deferred   COORDINATION: Deferred  SENSATION: Improved sensation in the RUE compared to prior days  GAIT: Deferred  Diagnostic Studies   STROKE WORK-UP Brain Imaging CTH WO: NAICA MRI WO: Acute L M1 perforator vascular territory infarct Vessel Imaging CTA head/neck: High-grade stenosis of distal L M1. No acute arterial abnormality in the neck or head.  Cardiovascular Diagnostics Telemetry/EKG did not show evidence of arrhythmia TTE : LVEF 65-70%, no shunt, LA size normal Labs LDL: 80 HbA1c: 5.2   The ASCVD Risk score (Arnett DK, et al., 2019) failed to calculate for the following reasons:   Risk score cannot be calculated because patient has a medical history suggesting prior/existing ASCVD  Past Medical History:  Diagnosis Date   Anxiety    Arthritis    Cancer (HCC)    Cataract    Constipation    Depression    GERD (gastroesophageal reflux disease)    Hypertension    Psoriasis    Past Surgical History:  Procedure Laterality Date   BELT ABDOMINOPLASTY     CATARACT EXTRACTION Bilateral    2013 and 2024   EYE SURGERY     JOINT REPLACEMENT  09/03/2022   MELANOMA EXCISION     TOTAL KNEE ARTHROPLASTY Right 09/03/2022   Social History   Tobacco Use   Smoking status: Former    Current packs/day: 0.00    Average packs/day: 0.5 packs/day for 10.0 years (5.0 ttl pk-yrs)    Types: Cigarettes    Quit date: 12/2008    Years since quitting: 15.0   Smokeless tobacco: Never   Tobacco comments:    Smoked off and on. Never much. socially  Vaping Use   Vaping status: Never Used  Substance  Use Topics   Alcohol use: Yes    Alcohol/week: 4.0 standard drinks of alcohol    Types: 4 Glasses of wine per week   Drug use: No   Family History  Problem Relation Age of Onset   Cancer Mother    Hypertension Mother    Arthritis Mother    Colon cancer Father    Hypertension Father    Alcohol abuse Father    Kidney cancer Son    Breast cancer Neg Hx    Uterine cancer Neg Hx    Ovarian cancer Neg Hx    No Known Allergies  ROS    Objective:    BP 124/78   Pulse 63   Temp 98.8 F (37.1 C)   Ht 5' 1 (1.549 m)   Wt 107 lb 2 oz (48.6 kg)   SpO2 99%   BMI 20.24 kg/m  BP Readings from Last 3 Encounters:  12/29/23 124/78  12/16/23 (!) 145/71  06/29/23 122/70   Wt Readings from Last 3 Encounters:  12/29/23 107 lb 2 oz (48.6 kg)  09/16/23 113 lb (51.3 kg)  06/29/23 113 lb 8 oz (51.5 kg)    Physical Exam Vitals and nursing note reviewed.  Constitutional:      Appearance: Normal appearance.  HENT:     Head: Normocephalic.     Right Ear: Tympanic membrane, ear canal and external ear normal.     Left Ear: Tympanic membrane, ear canal and external ear normal.  Eyes:     Extraocular Movements: Extraocular movements intact.     Conjunctiva/sclera: Conjunctivae normal.     Pupils: Pupils are equal, round, and reactive to light.  Cardiovascular:     Rate and Rhythm: Normal rate and regular rhythm.     Heart sounds: Normal heart sounds.  Pulmonary:     Effort: Pulmonary effort is normal.     Breath sounds: Normal breath sounds.  Musculoskeletal:     Right lower leg: No edema.     Left lower leg: No edema.  Neurological:     General: No focal deficit present.     Mental Status: She is alert and oriented to person, place, and time.  Psychiatric:        Mood and Affect: Mood normal.        Behavior: Behavior normal.        Thought Content: Thought content normal.        Judgment: Judgment normal.      No results found for any visits on 12/29/23.

## 2023-12-30 ENCOUNTER — Ambulatory Visit: Payer: Self-pay | Admitting: Family Medicine

## 2023-12-30 LAB — COMPREHENSIVE METABOLIC PANEL WITH GFR
AG Ratio: 2.3 (calc) (ref 1.0–2.5)
ALT: 22 U/L (ref 6–29)
AST: 22 U/L (ref 10–35)
Albumin: 4.4 g/dL (ref 3.6–5.1)
Alkaline phosphatase (APISO): 51 U/L (ref 37–153)
BUN: 13 mg/dL (ref 7–25)
CO2: 26 mmol/L (ref 20–32)
Calcium: 9.4 mg/dL (ref 8.6–10.4)
Chloride: 103 mmol/L (ref 98–110)
Creat: 0.6 mg/dL (ref 0.60–1.00)
Globulin: 1.9 g/dL (ref 1.9–3.7)
Glucose, Bld: 91 mg/dL (ref 65–99)
Potassium: 4.7 mmol/L (ref 3.5–5.3)
Sodium: 139 mmol/L (ref 135–146)
Total Bilirubin: 0.5 mg/dL (ref 0.2–1.2)
Total Protein: 6.3 g/dL (ref 6.1–8.1)
eGFR: 94 mL/min/1.73m2 (ref >=60)

## 2023-12-30 LAB — CBC WITH DIFFERENTIAL/PLATELET
Absolute Lymphocytes: 1005 {cells}/uL (ref 850–3900)
Absolute Monocytes: 352 {cells}/uL (ref 200–950)
Basophils Absolute: 71 {cells}/uL (ref 0–200)
Basophils Relative: 1.4 %
Eosinophils Absolute: 342 {cells}/uL (ref 15–500)
Eosinophils Relative: 6.7 %
HCT: 38.6 % (ref 35.0–45.0)
Hemoglobin: 12.5 g/dL (ref 11.7–15.5)
MCH: 31.5 pg (ref 27.0–33.0)
MCHC: 32.4 g/dL (ref 32.0–36.0)
MCV: 97.2 fL (ref 80.0–100.0)
MPV: 12.6 fL — ABNORMAL HIGH (ref 7.5–12.5)
Monocytes Relative: 6.9 %
Neutro Abs: 3330 {cells}/uL (ref 1500–7800)
Neutrophils Relative %: 65.3 %
Platelets: 268 Thousand/uL (ref 140–400)
RBC: 3.97 Million/uL (ref 3.80–5.10)
RDW: 13.8 % (ref 11.0–15.0)
Total Lymphocyte: 19.7 %
WBC: 5.1 Thousand/uL (ref 3.8–10.8)

## 2023-12-30 LAB — VITAMIN D 25 HYDROXY (VIT D DEFICIENCY, FRACTURES): Vit D, 25-Hydroxy: 60 ng/mL (ref 30–100)

## 2023-12-30 LAB — TSH: TSH: 2.26 m[IU]/L (ref 0.40–4.50)

## 2023-12-31 DIAGNOSIS — D84821 Immunodeficiency due to drugs: Secondary | ICD-10-CM | POA: Diagnosis not present

## 2024-01-04 DIAGNOSIS — R4701 Aphasia: Secondary | ICD-10-CM | POA: Diagnosis not present

## 2024-01-04 DIAGNOSIS — R2689 Other abnormalities of gait and mobility: Secondary | ICD-10-CM | POA: Diagnosis not present

## 2024-01-04 DIAGNOSIS — R531 Weakness: Secondary | ICD-10-CM | POA: Diagnosis not present

## 2024-01-04 DIAGNOSIS — I63512 Cerebral infarction due to unspecified occlusion or stenosis of left middle cerebral artery: Secondary | ICD-10-CM | POA: Diagnosis not present

## 2024-01-04 DIAGNOSIS — R4189 Other symptoms and signs involving cognitive functions and awareness: Secondary | ICD-10-CM | POA: Diagnosis not present

## 2024-01-05 DIAGNOSIS — R531 Weakness: Secondary | ICD-10-CM | POA: Diagnosis not present

## 2024-01-05 DIAGNOSIS — R2689 Other abnormalities of gait and mobility: Secondary | ICD-10-CM | POA: Diagnosis not present

## 2024-01-05 DIAGNOSIS — R4189 Other symptoms and signs involving cognitive functions and awareness: Secondary | ICD-10-CM | POA: Diagnosis not present

## 2024-01-05 DIAGNOSIS — I63512 Cerebral infarction due to unspecified occlusion or stenosis of left middle cerebral artery: Secondary | ICD-10-CM | POA: Diagnosis not present

## 2024-01-05 DIAGNOSIS — R4701 Aphasia: Secondary | ICD-10-CM | POA: Diagnosis not present

## 2024-01-06 DIAGNOSIS — K080 Exfoliation of teeth due to systemic causes: Secondary | ICD-10-CM | POA: Diagnosis not present

## 2024-01-09 ENCOUNTER — Other Ambulatory Visit: Payer: Self-pay | Admitting: Family Medicine

## 2024-01-09 DIAGNOSIS — E7800 Pure hypercholesterolemia, unspecified: Secondary | ICD-10-CM

## 2024-01-09 DIAGNOSIS — I10 Essential (primary) hypertension: Secondary | ICD-10-CM

## 2024-01-11 NOTE — Telephone Encounter (Signed)
 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)   [place] [facility] Aletha Bene, MD   6 months ago Primary hypertension   [place] [facility] Aletha Bene, MD              Refused Prescriptions Disp Refills   atorvastatin  (LIPITOR) 10 MG tablet [Pharmacy Med Name: ATORVASTATIN  10 MG TABLET] 90 tablet 1    Sig: TAKE 1 TABLET BY MOUTH DAILY     Cardiovascular:  Antilipid - Statins Failed - 01/11/2024  1:43 PM      Failed - Lipid Panel in normal range within the last 12 months    Cholesterol  Date Value Ref Range Status  06/29/2023 183 <200 mg/dL Final   LDL Cholesterol (Calc)  Date Value Ref Range Status  06/29/2023 99 mg/dL (calc) Final    Comment:    Reference range: <100 . Desirable range <100 mg/dL for primary prevention;   <70 mg/dL for patients with CHD or diabetic patients  with > or = 2 CHD risk factors. SABRA LDL-C is now calculated using the Martin-Hopkins  calculation, which is a validated novel method providing  better accuracy than the Friedewald equation in the  estimation of LDL-C.  Gladis APPLETHWAITE et al. SANDREA. 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

## 2024-01-12 DIAGNOSIS — I63512 Cerebral infarction due to unspecified occlusion or stenosis of left middle cerebral artery: Secondary | ICD-10-CM | POA: Diagnosis not present

## 2024-01-12 DIAGNOSIS — R4701 Aphasia: Secondary | ICD-10-CM | POA: Diagnosis not present

## 2024-01-12 DIAGNOSIS — R2689 Other abnormalities of gait and mobility: Secondary | ICD-10-CM | POA: Diagnosis not present

## 2024-01-12 DIAGNOSIS — R531 Weakness: Secondary | ICD-10-CM | POA: Diagnosis not present

## 2024-01-12 DIAGNOSIS — R4189 Other symptoms and signs involving cognitive functions and awareness: Secondary | ICD-10-CM | POA: Diagnosis not present

## 2024-01-14 DIAGNOSIS — R2689 Other abnormalities of gait and mobility: Secondary | ICD-10-CM | POA: Diagnosis not present

## 2024-01-14 DIAGNOSIS — R4189 Other symptoms and signs involving cognitive functions and awareness: Secondary | ICD-10-CM | POA: Diagnosis not present

## 2024-01-14 DIAGNOSIS — I63512 Cerebral infarction due to unspecified occlusion or stenosis of left middle cerebral artery: Secondary | ICD-10-CM | POA: Diagnosis not present

## 2024-01-14 DIAGNOSIS — R531 Weakness: Secondary | ICD-10-CM | POA: Diagnosis not present

## 2024-01-14 DIAGNOSIS — R4701 Aphasia: Secondary | ICD-10-CM | POA: Diagnosis not present

## 2024-01-15 DIAGNOSIS — R4701 Aphasia: Secondary | ICD-10-CM | POA: Diagnosis not present

## 2024-01-18 DIAGNOSIS — L400 Psoriasis vulgaris: Secondary | ICD-10-CM | POA: Diagnosis not present

## 2024-01-19 DIAGNOSIS — R4189 Other symptoms and signs involving cognitive functions and awareness: Secondary | ICD-10-CM | POA: Diagnosis not present

## 2024-01-19 DIAGNOSIS — R531 Weakness: Secondary | ICD-10-CM | POA: Diagnosis not present

## 2024-01-19 DIAGNOSIS — R4701 Aphasia: Secondary | ICD-10-CM | POA: Diagnosis not present

## 2024-01-19 DIAGNOSIS — R2689 Other abnormalities of gait and mobility: Secondary | ICD-10-CM | POA: Diagnosis not present

## 2024-01-19 DIAGNOSIS — I63512 Cerebral infarction due to unspecified occlusion or stenosis of left middle cerebral artery: Secondary | ICD-10-CM | POA: Diagnosis not present

## 2024-01-21 DIAGNOSIS — R4701 Aphasia: Secondary | ICD-10-CM | POA: Diagnosis not present

## 2024-01-21 DIAGNOSIS — R531 Weakness: Secondary | ICD-10-CM | POA: Diagnosis not present

## 2024-01-21 DIAGNOSIS — I63512 Cerebral infarction due to unspecified occlusion or stenosis of left middle cerebral artery: Secondary | ICD-10-CM | POA: Diagnosis not present

## 2024-01-21 DIAGNOSIS — R2689 Other abnormalities of gait and mobility: Secondary | ICD-10-CM | POA: Diagnosis not present

## 2024-01-21 DIAGNOSIS — R4189 Other symptoms and signs involving cognitive functions and awareness: Secondary | ICD-10-CM | POA: Diagnosis not present

## 2024-01-26 DIAGNOSIS — R4189 Other symptoms and signs involving cognitive functions and awareness: Secondary | ICD-10-CM | POA: Diagnosis not present

## 2024-01-26 DIAGNOSIS — I63512 Cerebral infarction due to unspecified occlusion or stenosis of left middle cerebral artery: Secondary | ICD-10-CM | POA: Diagnosis not present

## 2024-01-26 DIAGNOSIS — R531 Weakness: Secondary | ICD-10-CM | POA: Diagnosis not present

## 2024-01-26 DIAGNOSIS — R4701 Aphasia: Secondary | ICD-10-CM | POA: Diagnosis not present

## 2024-01-26 DIAGNOSIS — R2689 Other abnormalities of gait and mobility: Secondary | ICD-10-CM | POA: Diagnosis not present

## 2024-02-01 ENCOUNTER — Encounter: Payer: Self-pay | Admitting: Family Medicine

## 2024-02-01 ENCOUNTER — Ambulatory Visit: Admitting: Family Medicine

## 2024-02-01 VITALS — BP 126/82 | HR 75 | Temp 98.5°F | Ht 61.0 in | Wt 106.4 lb

## 2024-02-01 DIAGNOSIS — F419 Anxiety disorder, unspecified: Secondary | ICD-10-CM

## 2024-02-01 DIAGNOSIS — E785 Hyperlipidemia, unspecified: Secondary | ICD-10-CM | POA: Diagnosis not present

## 2024-02-01 DIAGNOSIS — G4709 Other insomnia: Secondary | ICD-10-CM | POA: Diagnosis not present

## 2024-02-01 DIAGNOSIS — Z79899 Other long term (current) drug therapy: Secondary | ICD-10-CM

## 2024-02-01 DIAGNOSIS — I10 Essential (primary) hypertension: Secondary | ICD-10-CM | POA: Diagnosis not present

## 2024-02-01 DIAGNOSIS — L409 Psoriasis, unspecified: Secondary | ICD-10-CM

## 2024-02-01 DIAGNOSIS — G8929 Other chronic pain: Secondary | ICD-10-CM

## 2024-02-01 LAB — LIPID PANEL
Cholesterol: 154 mg/dL (ref <200)
HDL: 61 mg/dL (ref >=50)
LDL Cholesterol (Calc): 75 mg/dL
Non-HDL Cholesterol (Calc): 93 mg/dL (ref <130)
Total CHOL/HDL Ratio: 2.5 (calc) (ref <5.0)
Triglycerides: 101 mg/dL (ref <150)

## 2024-02-01 NOTE — Progress Notes (Signed)
 Patient Office Visit  Assessment & Plan:  Primary hypertension  Other insomnia  Anxiety  Hyperlipidemia, unspecified hyperlipidemia type -     Lipid panel  Taking a statin medication  Chronic pain of right knee  Psoriasis of scalp   Assessment and Plan    Follow up visit- Routine wellness visit with no significant changes. Vaccinations up to date. Mammogram due in January, colonoscopy in three years. Blood work normal. Cholesterol medication increased in October. - Order mammogram in January. - Recheck cholesterol levels. - Continue current medications and lifestyle.  Hyperlipidemia Cholesterol medication increased to  Lipitor 20 mg in October. Tolerating well. - Recheck cholesterol levels.  Pain in right knee, post-surgical Persistent post-surgical knee pain affecting mobility. Managed with Salonpas and Tylenol. Advised against Advil due to blood thinner use. - Continue Salonpas for pain management. - Use Tylenol for pain relief.  Psoriasis Recent blood work ordered by dermatologist. - Continue methotrexate as prescribed.  Gastroesophageal reflux disease GERD managed effectively with Protonix . - Continue Protonix  as prescribed.  Insomnia Difficulty falling asleep but achieves 6-7 hours of sleep, feeling refreshed. No need for sleep aids. - Continue current sleep hygiene practices.      Return in about 4 months (around 06/01/2024), or if symptoms worsen or fail to improve, for hypertension, hyperlipidemia.   Subjective:    Patient ID: Carly Matthews, female    DOB: 06/01/1950  Age: 73 y.o. MRN: [id number]  Chief Complaint  Patient presents with   Medical Management of Chronic Issues    HPI Discussed the use of AI scribe software for clinical note transcription with the patient, who gave verbal consent to proceed.  History of Present Illness       History of Present Illness Carly Matthews is a 73 year old female who presents for follow-up of Stroke, her  right knee pain and overall health maintenance.  She experiences persistent pain in her right knee, which has been ongoing since her knee surgery few years ago per Dr. Lewanda. The pain is significant enough to prevent her from walking up stairs. She has not been taking any oral medication for the pain but has been using Salonpas patches for relief. She has an upcoming appointment with her Ortho surgeon in January.  She continues to attend speech therapy sessions and performs exercises at home following a stroke. She is unsure about the duration of the therapy but anticipates an evaluation soon to assess progress. She feels satisfied with her improvement so far, and her family also believes she is doing well. She does feel better compared to last office visit.   She is currently on blood thinners Brilinta  and ASA  and reports no bruising or issues related to the medication. Per neurosurgery she will be on this for about 6 months. No bruising or issues related to the medication. She is also taking Protonix  for reflux, which is effective. 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   She reports a good appetite but has lost some weight, which she attributes to eating less. She maintains a regular sleep pattern, getting six to seven hours of sleep per night, and feels refreshed upon waking.  She has received her flu, COVID, and RSV vaccinations and is up to date with her mammogram and colonoscopy screenings. Her cholesterol medication was increased in October, and she tolerates it well.  She mentions that her right arm is weaker than  her left, but she is right-handed and does not experience any functional limitations such as dropping objects. It is a subtle difference  Results LABS CBC: Hemoglobin within normal range (12/2023) Thyroid  function: Euthyroid (12/2023) Vitamin D : Sufficient (12/2023) Cholesterol: LDL elevated at 20 mg/dL  (89/7974)  DIAGNOSTIC Colonoscopy: Normal (08/2023)  Assessment and Plan Follow up visit for hypertension- good control overall  Routine wellness visit with no significant changes. Vaccinations up to date. Mammogram due in January, colonoscopy in three years. Blood work normal. Cholesterol medication increased in October. - Order mammogram in January. - Recheck cholesterol levels. - Continue current medications and lifestyle.  Hyperlipidemia Cholesterol medication increased to  Lipitor 20 mg in October. Tolerating well. - Recheck cholesterol levels.  Pain in right knee, post-surgical Persistent post-surgical knee pain affecting mobility. Managed with Salonpas and Tylenol. Advised against Advil due to blood thinner use. - Continue Salonpas for pain management. - Use Tylenol for pain relief.  Psoriasis Recent blood work ordered by dermatologist. - Continue methotrexate as prescribed.  Gastroesophageal reflux disease GERD managed effectively with Protonix . - Continue Protonix  as prescribed.  Insomnia Difficulty falling asleep but achieves 6-7 hours of sleep, feeling refreshed. No need for sleep aids. - Continue current sleep hygiene practices.  The ASCVD Risk score (Arnett DK, et al., 2019) failed to calculate for the following reasons:   Risk score cannot be calculated because patient has a medical history suggesting prior/existing ASCVD  Past Medical History:  Diagnosis Date   Anxiety    Arthritis    Cancer (HCC)    Cataract    Constipation    Depression    GERD (gastroesophageal reflux disease)    Hypertension    Psoriasis    Past Surgical History:  Procedure Laterality Date   BELT ABDOMINOPLASTY     CATARACT EXTRACTION Bilateral    2013 and 2024   EYE SURGERY     JOINT REPLACEMENT  09/03/2022   MELANOMA EXCISION     TOTAL KNEE ARTHROPLASTY Right 09/03/2022   Social History   Tobacco Use   Smoking status: Former    Current packs/day: 0.00    Average  packs/day: 0.5 packs/day for 10.0 years (5.0 ttl pk-yrs)    Types: Cigarettes    Quit date: 12/2008    Years since quitting: 15.1   Smokeless tobacco: Never   Tobacco comments:    Smoked off and on. Never much. socially  Vaping Use   Vaping status: Never Used  Substance Use Topics   Alcohol use: Yes    Alcohol/week: 4.0 standard drinks of alcohol    Types: 4 Glasses of wine per week   Drug use: No   Family History  Problem Relation Age of Onset   Cancer Mother    Hypertension Mother    Arthritis Mother    Colon cancer Father    Hypertension Father    Alcohol abuse Father    Kidney cancer Son    Breast cancer Neg Hx    Uterine cancer Neg Hx    Ovarian cancer Neg Hx    No Known Allergies  ROS    Objective:    BP 126/82   Pulse 75   Temp 98.5 F (36.9 C)   Ht 5' 1 (1.549 m)   Wt 106 lb 6 oz (48.3 kg)   SpO2 99%   BMI 20.10 kg/m  BP Readings from Last 3 Encounters:  02/01/24 126/82  12/29/23 124/78  12/16/23 (!) 145/71   Wt Readings from Last  3 Encounters:  02/01/24 106 lb 6 oz (48.3 kg)  12/29/23 107 lb 2 oz (48.6 kg)  09/16/23 113 lb (51.3 kg)    Physical Exam Vitals and nursing note reviewed.  Constitutional:      General: She is not in acute distress.    Appearance: Normal appearance.  HENT:     Head: Normocephalic.     Right Ear: Tympanic membrane, ear canal and external ear normal.     Left Ear: Tympanic membrane, ear canal and external ear normal.  Eyes:     Extraocular Movements: Extraocular movements intact.     Conjunctiva/sclera: Conjunctivae normal.     Pupils: Pupils are equal, round, and reactive to light.  Cardiovascular:     Rate and Rhythm: Normal rate and regular rhythm.     Heart sounds: Normal heart sounds.  Pulmonary:     Effort: Pulmonary effort is normal.     Breath sounds: Normal breath sounds.  Musculoskeletal:     Right lower leg: No edema.     Left lower leg: No edema.  Neurological:     General: No focal deficit  present.     Mental Status: She is alert and oriented to person, place, and time.     Comments: Speech appears more fluent today compared to last time  Psychiatric:        Mood and Affect: Mood normal.        Behavior: Behavior normal.        Thought Content: Thought content normal.      No results found for any visits on 02/01/24.

## 2024-02-02 ENCOUNTER — Ambulatory Visit: Payer: Self-pay | Admitting: Family Medicine

## 2024-04-01 ENCOUNTER — Emergency Department (HOSPITAL_COMMUNITY)
Admission: EM | Admit: 2024-04-01 | Discharge: 2024-04-01 | Disposition: A | Discharge status: Home / Self Care | Attending: Emergency Medicine | Admitting: Emergency Medicine

## 2024-04-01 ENCOUNTER — Emergency Department (HOSPITAL_COMMUNITY)

## 2024-04-01 DIAGNOSIS — Z7982 Long term (current) use of aspirin: Secondary | ICD-10-CM | POA: Insufficient documentation

## 2024-04-01 DIAGNOSIS — W108XXA Fall (on) (from) other stairs and steps, initial encounter: Secondary | ICD-10-CM | POA: Insufficient documentation

## 2024-04-01 DIAGNOSIS — S51812A Laceration without foreign body of left forearm, initial encounter: Secondary | ICD-10-CM | POA: Insufficient documentation

## 2024-04-01 DIAGNOSIS — M546 Pain in thoracic spine: Secondary | ICD-10-CM | POA: Insufficient documentation

## 2024-04-01 DIAGNOSIS — S0990XA Unspecified injury of head, initial encounter: Secondary | ICD-10-CM | POA: Insufficient documentation

## 2024-04-01 DIAGNOSIS — M542 Cervicalgia: Secondary | ICD-10-CM | POA: Insufficient documentation

## 2024-04-01 DIAGNOSIS — W19XXXA Unspecified fall, initial encounter: Secondary | ICD-10-CM

## 2024-04-01 DIAGNOSIS — S41112A Laceration without foreign body of left upper arm, initial encounter: Secondary | ICD-10-CM

## 2024-04-01 LAB — CBC WITH DIFFERENTIAL/PLATELET
Abs Immature Granulocytes: 0.08 10*3/uL — ABNORMAL HIGH (ref 0.00–0.07)
Basophils Absolute: 0.1 10*3/uL (ref 0.0–0.1)
Basophils Relative: 1 %
Eosinophils Absolute: 0.1 10*3/uL (ref 0.0–0.5)
Eosinophils Relative: 2 %
HCT: 37.9 % (ref 36.0–46.0)
Hemoglobin: 12.2 g/dL (ref 12.0–15.0)
Immature Granulocytes: 1 %
Lymphocytes Relative: 16 %
Lymphs Abs: 1 10*3/uL (ref 0.7–4.0)
MCH: 32 pg (ref 26.0–34.0)
MCHC: 32.2 g/dL (ref 30.0–36.0)
MCV: 99.5 fL (ref 80.0–100.0)
Monocytes Absolute: 0.3 10*3/uL (ref 0.1–1.0)
Monocytes Relative: 5 %
Neutro Abs: 4.6 10*3/uL (ref 1.7–7.7)
Neutrophils Relative %: 75 %
Platelets: 227 10*3/uL (ref 150–400)
RBC: 3.81 MIL/uL — ABNORMAL LOW (ref 3.87–5.11)
RDW: 15.5 % (ref 11.5–15.5)
WBC: 6.1 10*3/uL (ref 4.0–10.5)
nRBC: 0 % (ref 0.0–0.2)

## 2024-04-01 LAB — I-STAT CHEM 8, ED
BUN: 22 mg/dL (ref 8–23)
Calcium, Ion: 1.1 mmol/L — ABNORMAL LOW (ref 1.15–1.40)
Chloride: 102 mmol/L (ref 98–111)
Creatinine, Ser: 0.6 mg/dL (ref 0.44–1.00)
Glucose, Bld: 108 mg/dL — ABNORMAL HIGH (ref 70–99)
HCT: 38 % (ref 36.0–46.0)
Hemoglobin: 12.9 g/dL (ref 12.0–15.0)
Potassium: 4 mmol/L (ref 3.5–5.1)
Sodium: 140 mmol/L (ref 135–145)
TCO2: 24 mmol/L (ref 22–32)

## 2024-04-01 LAB — URINALYSIS, ROUTINE W REFLEX MICROSCOPIC
Bilirubin Urine: NEGATIVE
Glucose, UA: NEGATIVE mg/dL
Hgb urine dipstick: NEGATIVE
Ketones, ur: NEGATIVE mg/dL
Leukocytes,Ua: NEGATIVE
Nitrite: NEGATIVE
Protein, ur: NEGATIVE mg/dL
Specific Gravity, Urine: 1.017 (ref 1.005–1.030)
pH: 7 (ref 5.0–8.0)

## 2024-04-01 LAB — BASIC METABOLIC PANEL WITH GFR
Anion gap: 8 (ref 5–15)
BUN: 21 mg/dL (ref 8–23)
CO2: 27 mmol/L (ref 22–32)
Calcium: 8.6 mg/dL — ABNORMAL LOW (ref 8.9–10.3)
Chloride: 106 mmol/L (ref 98–111)
Creatinine, Ser: 0.56 mg/dL (ref 0.44–1.00)
GFR, Estimated: >60 mL/min
Glucose, Bld: 105 mg/dL — ABNORMAL HIGH (ref 70–99)
Potassium: 4.2 mmol/L (ref 3.5–5.1)
Sodium: 141 mmol/L (ref 135–145)

## 2024-04-01 MED ORDER — IOHEXOL 350 MG/ML SOLN
75.0000 mL | Freq: Once | INTRAVENOUS | Status: AC | PRN
  Administered 2024-04-01: 75 mL via INTRAVENOUS

## 2024-04-01 NOTE — ED Notes (Signed)
 Pt says she is unable to provide a urine sample at this time. She will press her call light when she feels she needs to use the restroom.

## 2024-04-01 NOTE — ED Provider Notes (Signed)
 This is a 74 year old female who is on Brilinta  presented to the ED with a fall down about 6 flight of steps at home.  She is complaining of pain mostly in her mid thoracic back worse with sitting upright.  She has an abrasion to her left arm and also struck her head.  She does have a headache.  She is here with her husband.  Denies numbness or weakness or radiculopathy into her hands or legs.  I did assume care of this patient from earlier ED provider.   Physical Exam  BP (!) 148/84   Pulse 76   Temp 97.7 F (36.5 C) (Oral)   Resp 16   SpO2 95%   Physical Exam  Procedures  Procedures  ED Course / MDM    Medical Decision Making Amount and/or Complexity of Data Reviewed Labs: ordered. Radiology: ordered.  Risk Prescription drug management.   External records reviewed including hospital discharge summary from October 2025 at outside medical system.  She was admitted that time for stroke workup.  She was discharged on aspirin  81 mg indefinitely for stroke prevention as well as Brilinta  90 mg for 6 months per neurosurgery recommendations for post stent care (NSGY placed Left M1 stent on 10/11).  Patient's trauma imagings reviewed here - notable for no emergent finding  Unfortunately with this type of superficial skin tear, there is no flap amenable to resuturing.  It will need to heal by secondary intention  The patient's wound was irrigated the bedside and applied quick-clot powder to stem oozing/bleeding, then covered with xeroform, kerlex and coban.  After 30 minutes her dressings were reexamined and remained hemostatic.       Cottie Donnice PARAS, MD 04/01/24 [phone]

## 2024-04-01 NOTE — ED Provider Notes (Signed)
 " [facility] Provider Note   CSN: [id number] Arrival date & time: 04/01/24  1422     Patient presents with: No chief complaint on file.   Carly Matthews is a 74 y.o. female.  She is brought in by EMS after she fell down a flight of stairs at home.  Level 2 trauma.  Complaining of pain in her head neck and upper back.  Denies loss of consciousness.  No numbness or weakness.  She said she was able to ambulate after the fall.   The history is provided by the patient and the EMS personnel.  Fall This is a new problem. The problem has not changed since onset.Associated symptoms include headaches. Pertinent negatives include no chest pain, no abdominal pain and no shortness of breath. The symptoms are aggravated by bending and twisting. Nothing relieves the symptoms. She has tried nothing for the symptoms. The treatment provided no relief.       Prior to Admission medications  Medication Sig Start Date End Date Taking? Authorizing Provider  amLODipine  (NORVASC ) 5 MG tablet TAKE 1 TABLET BY MOUTH DAILY 01/11/24   Aletha Bene, MD  aspirin  81 MG chewable tablet Chew 81 mg by mouth daily. 12/16/23   [provider]  atorvastatin  (LIPITOR) 20 MG tablet Take 1 tablet (20 mg total) by mouth daily. 12/29/23   Aletha Bene, MD  Calcium  Carb-Cholecalciferol (OYSTER SHELL CALCIUM  W/D) 500-5 MG-MCG TABS Take by mouth.    [provider]  DULoxetine  (CYMBALTA ) 60 MG capsule TAKE 1 CAPSULE BY MOUTH DAILY 12/29/23   Aletha Bene, MD  folic acid (FOLVITE) 1 MG tablet Take 1 mg by mouth daily. 12/11/21   [provider]  methotrexate (RHEUMATREX) 2.5 MG tablet Take 15 mg by mouth once a week.    [provider]  Multiple Vitamin (MULTI-VITAMIN) tablet Take 1 tablet by mouth daily. 09/10/20   [provider]  pantoprazole  (PROTONIX ) 40 MG tablet Take 1 tablet (40 mg total) by mouth daily. 10/12/23   Aletha Bene, MD    Allergies: Patient has no known allergies.    Review of Systems  Respiratory:  Negative for shortness of breath.   Cardiovascular:  Negative for chest pain.  Gastrointestinal:  Negative for abdominal pain.  Neurological:  Positive for headaches.    Updated Vital Signs BP (!) 148/84   Pulse 76   Temp 97.7 F (36.5 C) (Oral)   Resp 16   SpO2 95%   Physical Exam Vitals and nursing note reviewed.  Constitutional:      General: She is not in acute distress.    Appearance: Normal appearance. She is well-developed.  HENT:     Head: Normocephalic and atraumatic.  Eyes:     Conjunctiva/sclera: Conjunctivae normal.  Neck:     Comments: In cervical collar trach midline Cardiovascular:     Rate and Rhythm: Normal rate and regular rhythm.     Heart sounds: No murmur heard. Pulmonary:     Effort: Pulmonary effort is normal. No respiratory distress.     Breath sounds: Normal breath sounds.  Abdominal:     Palpations: Abdomen is soft.     Tenderness: There is no abdominal tenderness.  Musculoskeletal:        General: Tenderness present. No deformity.     Comments: She is Temno her upper back.  She has got some to skin tears left forearm.  Full range of motion of upper and lower  extremities without any pain or limitations.  Skin:    General: Skin is warm and dry.     Capillary Refill: Capillary refill takes less than 2 seconds.  Neurological:     General: No focal deficit present.     Mental Status: She is alert and oriented to person, place, and time.     Sensory: No sensory deficit.     Motor: No weakness.  Psychiatric:        Mood and Affect: Mood normal.     (all labs ordered are listed, but only abnormal results are displayed) Labs Reviewed  BASIC METABOLIC PANEL WITH GFR - Abnormal; Notable for the following components:      Result Value   Glucose, Bld 105 (*)    Calcium  8.6 (*)    All other components within normal limits  CBC WITH  DIFFERENTIAL/PLATELET - Abnormal; Notable for the following components:   RBC 3.81 (*)    Abs Immature Granulocytes 0.08 (*)    All other components within normal limits  I-STAT CHEM 8, ED - Abnormal; Notable for the following components:   Glucose, Bld 108 (*)    Calcium , Ion 1.10 (*)    All other components within normal limits  URINALYSIS, ROUTINE W REFLEX MICROSCOPIC    EKG: None  Radiology: [facility] Chest Port 1 View Result Date: 04/01/2024 CLINICAL DATA:  Fall.  Upper back pain. EXAM: PORTABLE CHEST 1 VIEW COMPARISON:  None Available. FINDINGS: Triangular shaped density in the left mid lung could represent atelectasis or an overlying structure. Remainder of the lungs are clear. Negative for pneumothorax. Dextroscoliosis in the thoracic spine. Heart size is stable. IMPRESSION: 1. Density in the left mid lung could represent atelectasis or an overlying structure. Recommend attention to this area on follow-up imaging. 2. Otherwise, no acute chest findings. Electronically Signed   By: Juliene Balder M.D.   On: 04/01/2024 15:41     Procedures   Medications Ordered in the ED - No data to display                                  Medical Decision Making Amount and/or Complexity of Data Reviewed Labs: ordered. Radiology: ordered.   This patient complains of slip and fall downstairs, head neck upper back pain; this involves an extensive number of treatment Options and is a complaint that carries with it a high risk of complications and morbidity. The differential includes fracture, contusion, dislocation, bleed  I ordered, reviewed and interpreted labs, which included hemoglobin stable normal renal function I ordered imaging studies which included chest x-ray and I independently    visualized and interpreted imaging which showed no acute findings.  Other imaging pending at time of signout Additional history obtained from EMS Previous records obtained and reviewed in epic including  outpatient [facility] notes Social determinants considered, no significant barriers Critical Interventions: None  After the interventions stated above, I reevaluated the patient and found awake alert neuro intact Admission and further testing considered, she is signed out to oncoming provider Dr. Cottie to follow-up on results of imaging and reassessment of patient.  Likely can be discharged if no significant findings.      Final diagnoses:  Fall, initial encounter  Traumatic injury of head, initial encounter    ED Discharge Orders     None          Towana Ozell BROCKS, MD 04/01/24 1640  "

## 2024-04-01 NOTE — Progress Notes (Signed)
 Orthopedic Tech Progress Note Patient Details:  Carly Matthews 1950-10-07 [id number]  Level II trauma, no ortho tech orders at this time.  Patient ID: Carly Matthews, female   DOB: Oct 05, 1950, 74 y.o.   MRN: [id number]  C[facility] 04/01/2024, 6:02 PM

## 2024-04-01 NOTE — Discharge Instructions (Addendum)
 You have a skin laceration of your left arm.  This will likely take a few weeks to completely heal, and likely it will form a scab beforehand.  I recommend that you keep the current dressing in place for the next 2-3 days.  If you need to shower, cover this wound with a plastic bag that is rubberbanded to keep it dry.    After 2 or 3 days, you can take down this dressing, rinse gently as needed with regular soap and water, and recover again with 1.) Xeroform strip (yellow ointment gauze), then 2.) White Kerlex Square, then 3) [employer] dressing.    After 1 week, you can cover the wound with regular bandaide, as it should not longer be oozing or bleeding. Have your doctor re-examine the wound in the office in 1-2 weeks.

## 2024-04-01 NOTE — Progress Notes (Signed)
 Spoke with patient and advised that her [place] results came back negative.  Next up would be a bone scan per Dr. Carry directives.  Imaging center will call to schedule this.  Let us  know what the date is so that we can get her to follow-up appointment with Dr. Lorel after bone scan is performed.  [address] [place], ATC

## 2024-04-01 NOTE — ED Triage Notes (Signed)
 FOT bibems after falling down approx 5 steps. Pt hit her forehead/nose, has skin tear on left forearm.   146/82 HR 72 95% RA 20G RAC

## 2024-06-07 ENCOUNTER — Ambulatory Visit: Admitting: Family Medicine

## 2024-09-21 ENCOUNTER — Ambulatory Visit
